# Patient Record
Sex: Female | Born: 1965
Health system: Southern US, Community
[De-identification: ages and names within clinical notes are randomized; demographics above are authoritative.]

## PROBLEM LIST (undated history)

## (undated) DIAGNOSIS — K589 Irritable bowel syndrome without diarrhea: Secondary | ICD-10-CM

## (undated) DIAGNOSIS — I10 Essential (primary) hypertension: Secondary | ICD-10-CM

## (undated) DIAGNOSIS — K219 Gastro-esophageal reflux disease without esophagitis: Secondary | ICD-10-CM

## (undated) DIAGNOSIS — K921 Melena: Secondary | ICD-10-CM

## (undated) DIAGNOSIS — E079 Disorder of thyroid, unspecified: Secondary | ICD-10-CM

## (undated) DIAGNOSIS — K635 Polyp of colon: Secondary | ICD-10-CM

## (undated) HISTORY — PX: TONSILLECTOMY: SUR1361

## (undated) HISTORY — DX: Irritable bowel syndrome, unspecified: K58.9

## (undated) HISTORY — DX: Gastro-esophageal reflux disease without esophagitis: K21.9

## (undated) HISTORY — PX: FOOT SURGERY: SHX648

## (undated) HISTORY — PX: CHOLECYSTECTOMY: SHX55

## (undated) HISTORY — DX: Essential (primary) hypertension: I10

## (undated) HISTORY — DX: Melena: K92.1

## (undated) HISTORY — PX: DENTAL SURGERY: SHX609

## (undated) HISTORY — DX: Polyp of colon: K63.5

---

## 2006-08-13 HISTORY — PX: ESOPHAGOGASTRODUODENOSCOPY: SHX1529

## 2007-01-18 ENCOUNTER — Ambulatory Visit (HOSPITAL_COMMUNITY): Admission: RE | Admit: 2007-01-18 | Discharge: 2007-01-18 | Payer: Self-pay | Admitting: Family Medicine

## 2007-01-31 ENCOUNTER — Encounter (HOSPITAL_COMMUNITY): Admission: RE | Admit: 2007-01-31 | Discharge: 2007-03-02 | Payer: Self-pay | Admitting: Family Medicine

## 2007-02-22 ENCOUNTER — Ambulatory Visit: Payer: Self-pay | Admitting: Internal Medicine

## 2007-03-24 ENCOUNTER — Ambulatory Visit: Payer: Self-pay | Admitting: Internal Medicine

## 2008-03-14 ENCOUNTER — Ambulatory Visit: Payer: Self-pay | Admitting: Internal Medicine

## 2008-12-11 ENCOUNTER — Encounter (HOSPITAL_COMMUNITY): Admission: RE | Admit: 2008-12-11 | Discharge: 2009-01-10 | Payer: Self-pay | Admitting: Family Medicine

## 2008-12-17 DIAGNOSIS — K449 Diaphragmatic hernia without obstruction or gangrene: Secondary | ICD-10-CM | POA: Insufficient documentation

## 2008-12-17 DIAGNOSIS — I10 Essential (primary) hypertension: Secondary | ICD-10-CM | POA: Insufficient documentation

## 2008-12-17 DIAGNOSIS — R1011 Right upper quadrant pain: Secondary | ICD-10-CM | POA: Insufficient documentation

## 2008-12-17 DIAGNOSIS — K219 Gastro-esophageal reflux disease without esophagitis: Secondary | ICD-10-CM

## 2008-12-18 ENCOUNTER — Ambulatory Visit: Payer: Self-pay | Admitting: Internal Medicine

## 2008-12-26 ENCOUNTER — Observation Stay (HOSPITAL_COMMUNITY): Admission: RE | Admit: 2008-12-26 | Discharge: 2008-12-27 | Payer: Self-pay | Admitting: General Surgery

## 2008-12-26 ENCOUNTER — Encounter (INDEPENDENT_AMBULATORY_CARE_PROVIDER_SITE_OTHER): Payer: Self-pay | Admitting: General Surgery

## 2009-08-27 ENCOUNTER — Ambulatory Visit: Payer: Self-pay | Admitting: Gastroenterology

## 2009-08-27 DIAGNOSIS — K921 Melena: Secondary | ICD-10-CM | POA: Insufficient documentation

## 2009-08-27 DIAGNOSIS — R109 Unspecified abdominal pain: Secondary | ICD-10-CM | POA: Insufficient documentation

## 2009-08-28 ENCOUNTER — Encounter: Payer: Self-pay | Admitting: Internal Medicine

## 2009-09-16 ENCOUNTER — Ambulatory Visit (HOSPITAL_COMMUNITY): Admission: RE | Admit: 2009-09-16 | Discharge: 2009-09-16 | Payer: Self-pay | Admitting: Internal Medicine

## 2009-09-16 ENCOUNTER — Ambulatory Visit: Payer: Self-pay | Admitting: Internal Medicine

## 2009-09-16 ENCOUNTER — Encounter: Payer: Self-pay | Admitting: Internal Medicine

## 2009-09-16 HISTORY — PX: COLONOSCOPY: SHX174

## 2009-11-04 ENCOUNTER — Ambulatory Visit: Payer: Self-pay | Admitting: Internal Medicine

## 2009-11-04 DIAGNOSIS — K589 Irritable bowel syndrome without diarrhea: Secondary | ICD-10-CM

## 2009-11-04 DIAGNOSIS — D126 Benign neoplasm of colon, unspecified: Secondary | ICD-10-CM | POA: Insufficient documentation

## 2010-01-01 ENCOUNTER — Telehealth (INDEPENDENT_AMBULATORY_CARE_PROVIDER_SITE_OTHER): Payer: Self-pay

## 2010-03-06 ENCOUNTER — Encounter (INDEPENDENT_AMBULATORY_CARE_PROVIDER_SITE_OTHER): Payer: Self-pay | Admitting: *Deleted

## 2010-12-02 NOTE — Progress Notes (Signed)
Summary: change in rx  Phone Note Call from Patient Call back at Work Phone [phone]   Caller: Patient Summary of Call: pt called- (left voicemail)- Zegerid is costing her $108.00 a month after insurance. pt has already tried prilosec, aciphex, and nexium. She wants to know if there is anything cheaper she can try. We do not have any Zegerid rebate cards. please advise  Initial call taken by: Hendricks Limes LPN,  January 01, 2010 8:57 AM     Appended Document: change in rx (PPI)    Prescriptions: LANSOPRAZOLE 30 MG CPDR (LANSOPRAZOLE) one by mouth 30 mins before breakfast daily  #30 x 11   Entered and Authorized by:   Leanna Battles. Dixon Boos   Signed by:   Leanna Battles Lewis PA-C on 01/01/2010   Method used:   Print then Give to Patient   RxID:   [id number]    Let's try this one. Have her let us know if still too expensive. Please fax to pharmacy of choice.  Appended Document: change in rx LM at work number for pt to return call with pharmacy information  Appended Document: change in rx rx faxed to [facility]/ [facility]

## 2010-12-02 NOTE — Assessment & Plan Note (Signed)
Summary: PP FU IN 6 WEEKS WITH EXTENDER/SS   Visit Type:  Follow-up Visit Primary Care Provider:  Phillips Odor  Chief Complaint:  PP f/u procedure.  History of Present Illness: Miss Michelle [name] is here for f/u recent TCS done for chronic intermittent toilet tissue hematochezia.  She has GERD well-controlled on name brand Zegerid.  She had normal TI on recent colonoscopy.  Friable internal hemorrhoids treated with Canasa suppositories.  Sigmoid colon polyp was hyperplastic. Denies further rectal bleeding.  BM once per day. No abd pain.  GYN exam planned for 11/27/09.  Menstrual cycles irregular.       Current Medications (verified): 1)  Atenolol 25 Mg Tabs (Atenolol) .... Once Daily 2)  Fish Oil .... Once Daily 3)  Multivitamins  Tabs (Multiple Vitamin) .... As Directed 4)  Advil 200 Mg Tabs (Ibuprofen) .... As Needed 5)  Zegerid 40-1680 Mg Pack (Omeprazole-Sodium Bicarbonate) .... Take 1 Tablet By Mouth Once A Day 6)  Valium 2 Mg Tabs (Diazepam) .... 1/2 Tablet in The Am and 1/2 Tablet in The Pm  Allergies (verified): No Known Drug Allergies  Review of Systems      See HPI  Vital Signs:  Patient profile:   45 year old female Height:      64.5 inches Weight:      123 pounds BMI:     20.86 Temp:     98.0 degrees F oral Pulse rate:   64 / minute BP sitting:   98 / 72  (left arm) Cuff size:   regular  Vitals Entered By: Cloria Spring LPN (November 04, 2009 10:36 AM)  Physical Exam  General:  Well developed, well nourished, no acute distress. Head:  Normocephalic and atraumatic. Eyes:  Sclera nonicteric. Mouth:  OP moist. Abdomen:  Bowel sounds normal.  Abdomen is soft, nontender, nondistended.  No rebound or guarding.  No hepatosplenomegaly, masses or hernias.  No abdominal bruits.  Neurologic:  Alert and  oriented x4;  grossly normal neurologically. Skin:  Intact without significant lesions or rashes. Psych:  Alert and cooperative. Normal mood and affect.  Impression &  Recommendations:  Problem # 1:  HEMATOCHEZIA (ICD-578.1) Secondary to hemorrhoids, resolved.  Problem # 2:  IBS (ICD-564.1) Doing well. Orders: Est. Patient Level II (16109)  Problem # 3:  GERD (ICD-530.81) Controlled on Zegerid. Orders: Est. Patient Level II (60454)  Problem # 4:  COLONIC POLYPS, HYPERPLASTIC (ICD-211.3) Due next colonoscopy in 2020 (10 year f/u given no personal h/o adenomatous polyps or FH of CRC).

## 2010-12-02 NOTE — Letter (Signed)
Summary: Recall Office Visit  [facility]  [address]   [place], [place] [address]   Phone: [phone]  Fax: [phone]      Mar 06, 2010   [facility] [address] [place], [place]  [address] 1966/05/03   Dear Ms. Knauer,   According to our records, it is time for you to schedule a follow-up office visit with [place].   At your convenience, please call [phone] to schedule an office visit. If you have any questions, concerns, or feel that this letter is in error, we would appreciate your call.   Sincerely,    Diana Eves  [facility] Ph: [phone]   Fax: [phone]

## 2011-01-19 IMAGING — US US ABDOMEN COMPLETE
1 series · 14 of 25 positions shown · non-contrast
Comparison: 01/18/2007

CLINICAL DATA: Abdominal pain

ABDOMEN ULTRASOUND
TECHNIQUE: Complete abdominal ultrasound examination was performed
including evaluation of the liver, gallbladder, bile ducts,
pancreas, kidneys, spleen, IVC, and abdominal aorta.

[Series 1: unknown · 0.30mm/px · 14 of 59 slices shown]
[im 1/59]
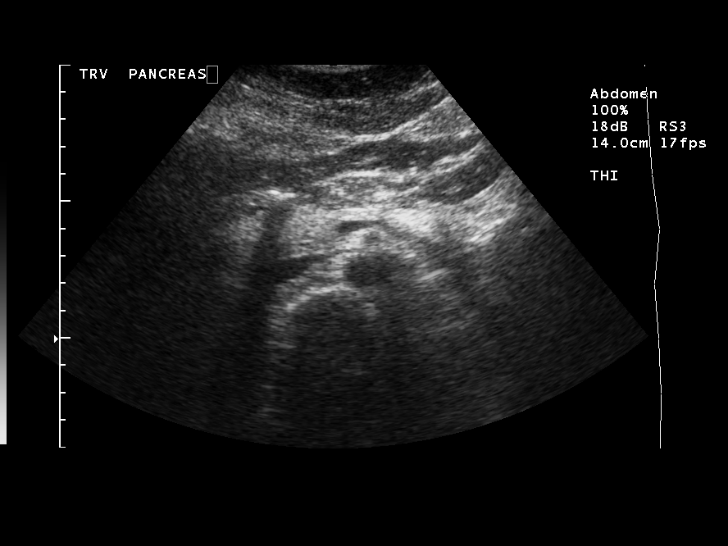
[im 5/59]
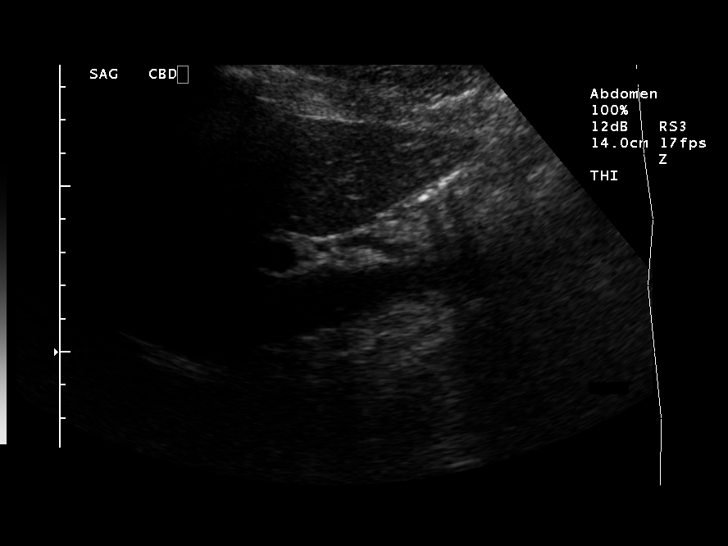
[im 10/59]
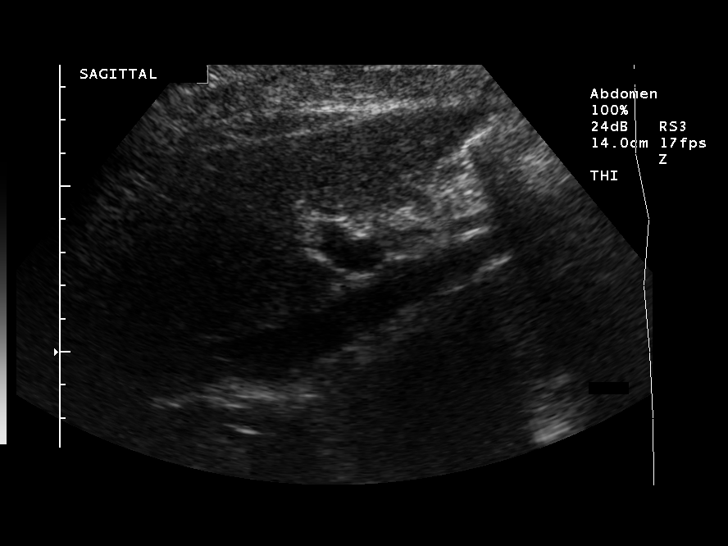
[im 15/59]
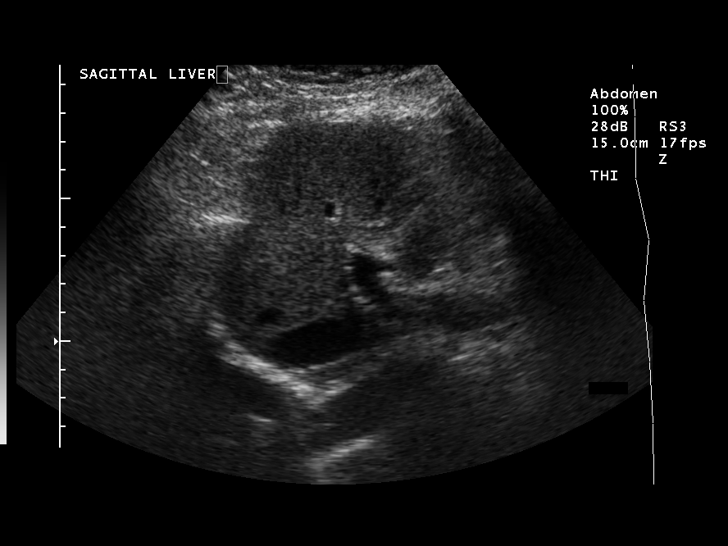
[im 20/59]
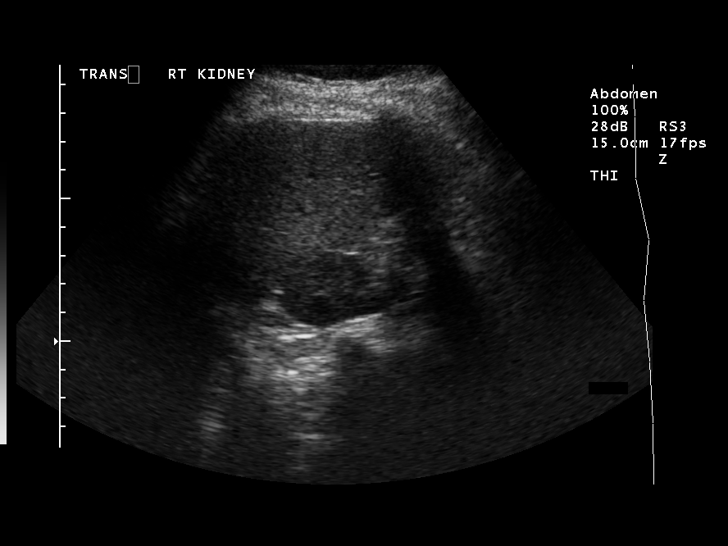
[im 22/59]
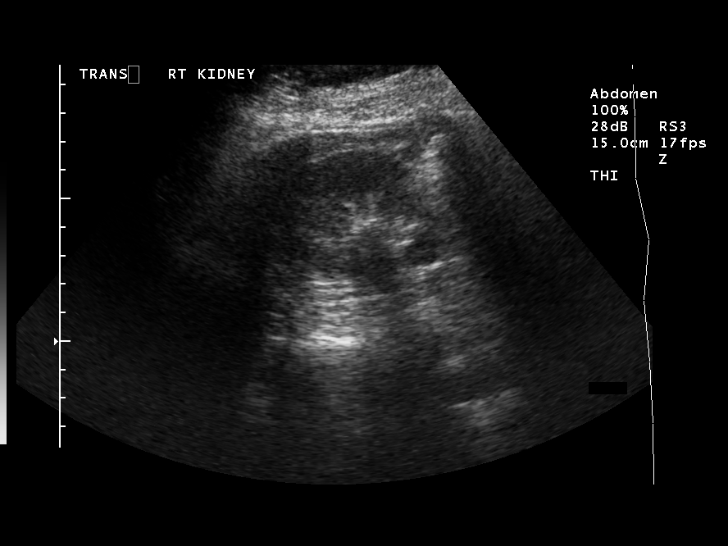
[im 27/59]
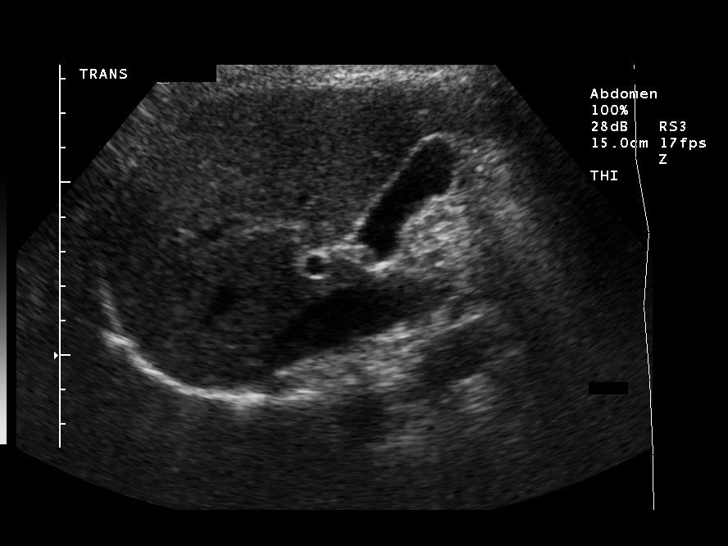
[im 32/59]
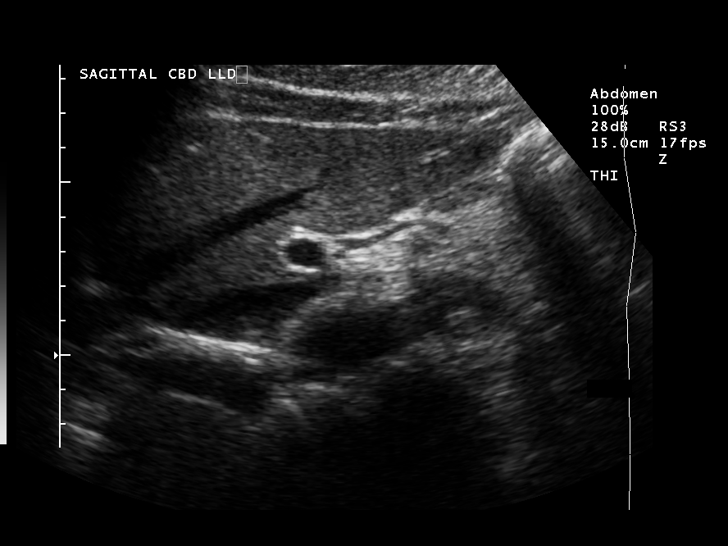
[im 37/59]
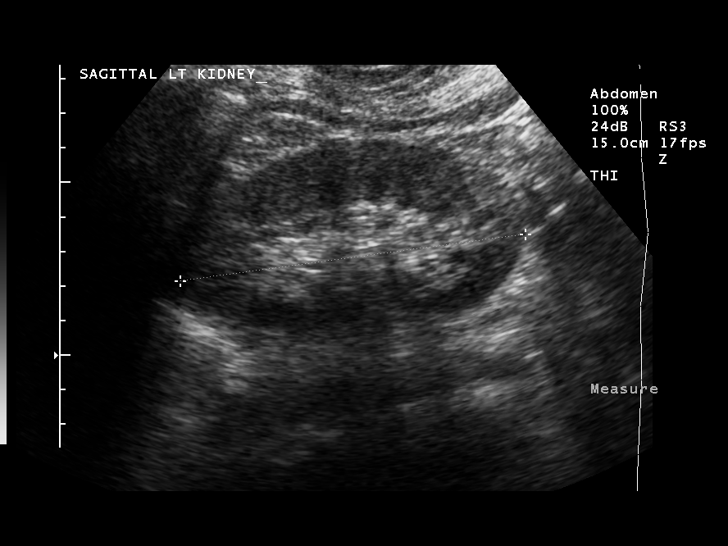
[im 39/59]
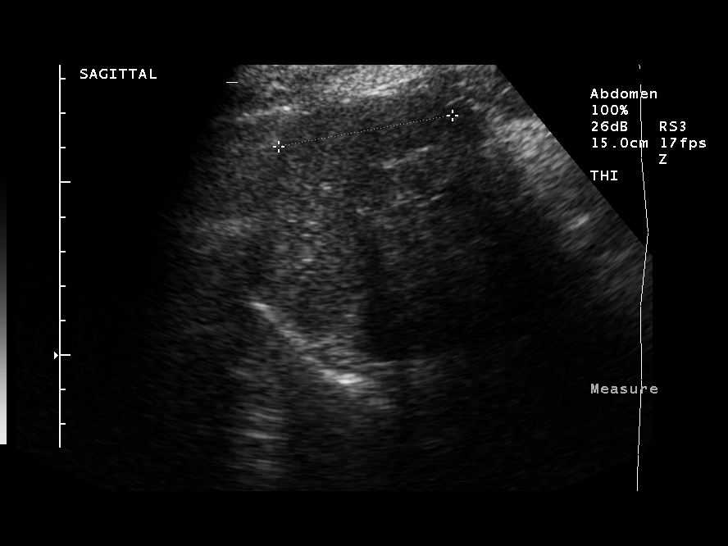
[im 44/59]
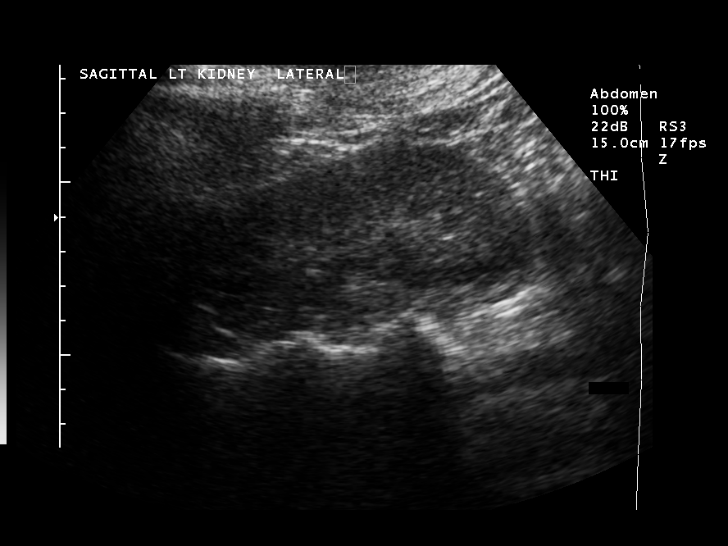
[im 49/59]
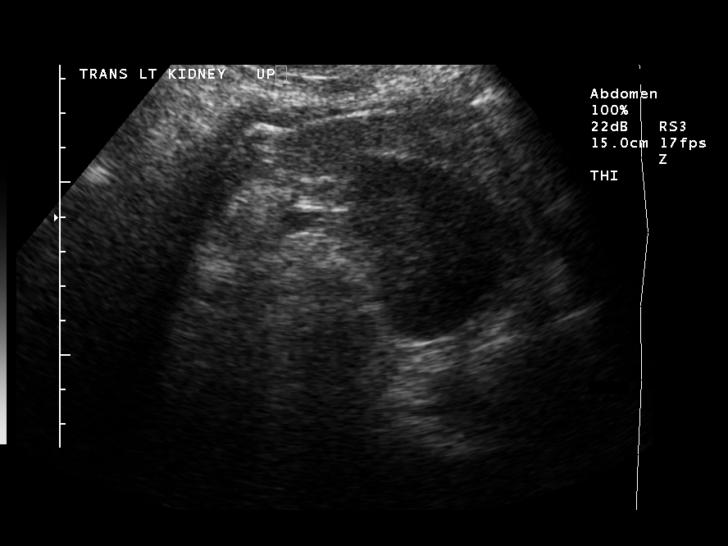
[im 54/59]
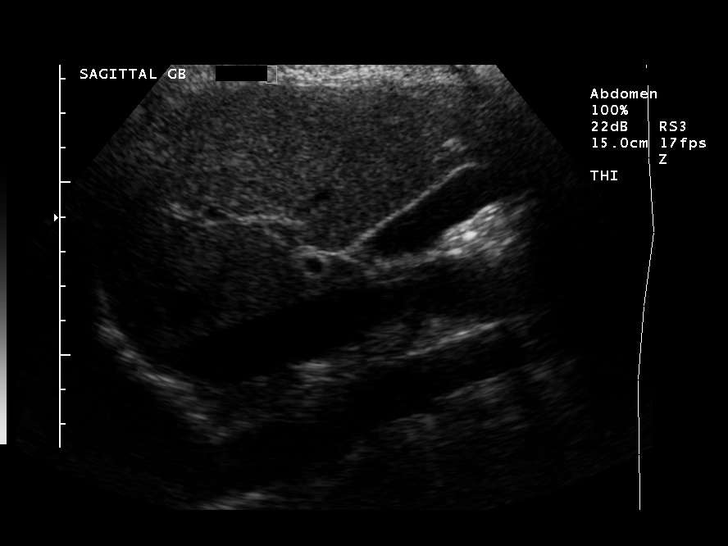
[im 59/59]
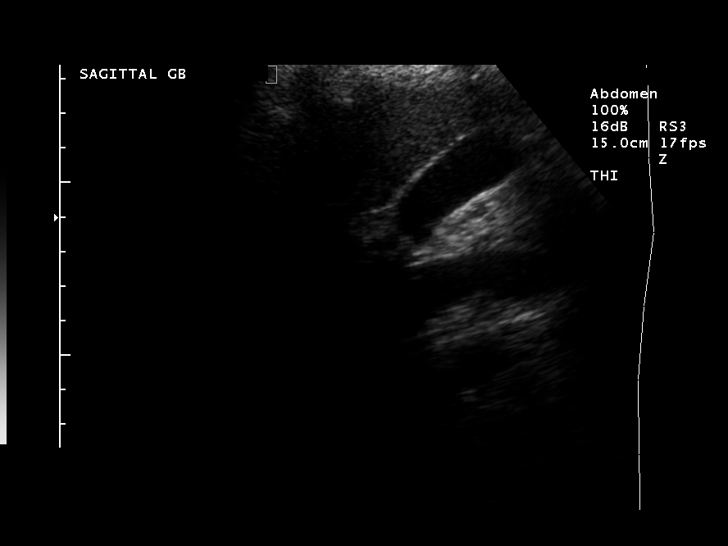

[14 of 25 positions shown; findings below may reference images not displayed]

FINDINGS: Gallbladder normally distended without stones or wall thickening.
No sonographic Murphy sign.
Common bile duct normal caliber, 5.2 mm diameter.
Liver, pancreas, and spleen normal appearance, spleen 5.1 cm
length.
Kidneys normal size and morphology, 9.6 cm length right and 10.0 cm
length left.
Aorta and IVC normal.
No free fluid.
IMPRESSION: No acute upper abdominal abnormalities.

REF:A1 DICTATED: 12/11/2008 [DATE]

## 2011-02-17 LAB — DIFFERENTIAL
Basophils Absolute: 0 10*3/uL (ref 0.0–0.1)
Basophils Absolute: 0 10*3/uL (ref 0.0–0.1)
Eosinophils Relative: 0 % (ref 0–5)
Lymphocytes Relative: 15 % (ref 12–46)
Lymphocytes Relative: 24 % (ref 12–46)
Lymphs Abs: 1.3 10*3/uL (ref 0.7–4.0)
Lymphs Abs: 1.5 10*3/uL (ref 0.7–4.0)
Monocytes Absolute: 0.6 10*3/uL (ref 0.1–1.0)
Neutro Abs: 4.4 10*3/uL (ref 1.7–7.7)
Neutrophils Relative %: 69 % (ref 43–77)

## 2011-02-17 LAB — BASIC METABOLIC PANEL
BUN: 9 mg/dL (ref 6–23)
CO2: 26 mEq/L (ref 19–32)
Calcium: 9.1 mg/dL (ref 8.4–10.5)
Chloride: 108 mEq/L (ref 96–112)
GFR calc non Af Amer: >60 mL/min (ref >60)
GFR calc non Af Amer: >60 mL/min (ref >60)
Glucose, Bld: 101 mg/dL — ABNORMAL HIGH (ref 70–99)
Glucose, Bld: 87 mg/dL (ref 70–99)
Potassium: 4 mEq/L (ref 3.5–5.1)
Sodium: 139 mEq/L (ref 135–145)

## 2011-02-17 LAB — HEPATIC FUNCTION PANEL
ALT: 26 U/L (ref 0–35)
AST: 15 U/L (ref 0–37)
Albumin: 3.9 g/dL (ref 3.5–5.2)
Alkaline Phosphatase: 51 U/L (ref 39–117)
Bilirubin, Direct: 0.1 mg/dL (ref 0.0–0.3)
Bilirubin, Direct: 0.1 mg/dL (ref 0.0–0.3)
Indirect Bilirubin: 0.5 mg/dL (ref 0.3–0.9)
Total Protein: 6.2 g/dL (ref 6.0–8.3)

## 2011-02-17 LAB — CBC
HCT: 35.4 % — ABNORMAL LOW (ref 36.0–46.0)
Hemoglobin: 12 g/dL (ref 12.0–15.0)
MCV: 93.5 fL (ref 78.0–100.0)
Platelets: 157 10*3/uL (ref 150–400)
RDW: 13.2 % (ref 11.5–15.5)
RDW: 13.4 % (ref 11.5–15.5)
WBC: 6.4 10*3/uL (ref 4.0–10.5)

## 2011-02-17 LAB — AMYLASE: Amylase: 61 U/L (ref 27–131)

## 2011-02-17 LAB — HCG, QUANTITATIVE, PREGNANCY: hCG, Beta Chain, Quant, S: <2 m[IU]/mL (ref <5)

## 2011-03-17 NOTE — Assessment & Plan Note (Signed)
Michelle [name], Michelle [name]                 CHART#:  [id number]   DATE:  12/18/2008                       DOB:  09-03-1966   CHIEF COMPLAINT:  Nausea, diarrhea, gallbladder problems.   SUBJECTIVE:  The patient is here for a followup.  She called and made  the appointment because of ongoing nausea, abdominal pain, and some  diarrhea.  We last saw her in May 2009.  She has a history of  gastroesophageal reflux disease as well as intermittent right upper  quadrant abdominal pain, well controlled on low-fat diet.  We had felt  that her symptoms were likely emanating from her gallbladder probably in  the involving biliary dyskinesia.  She states around January she started  having more and more postprandial nausea.  She has lost 8 pounds because  she has not been able to eat.  Her symptoms all began with what she  described as GERD and indigestion.  She saw her PCP who switched her  from Aciphex to Zegerid.  The Zegerid helped her heartburn and  indigestion.  She continued to have postprandial nausea, however.  Over  the last couple of weeks, she has noted change in bowel movements from a  bowel movement every day to every other day to now having postprandial  loose stools 2 and 3 times a day, depending on how many times she eats.  She has also had a little bit of bright red blood on the toilet tissue  intermittently with these symptoms.  She has never had a colonoscopy.  She admittedly is very stressed out with having to work and take care of  her family and an elderly mother who she is having to keep check on  frequently.  She recently had abdominal ultrasound, which was  unremarkable.  She had a HIDA scan yesterday, which showed a gallbladder  ejection fraction of 26%, which had declined from 2 years ago when her  gallbladder ejection fraction was 78%.  She states after the half-and-  half cream she did have some nausea and about an hour later had more  nausea and then developed loose stool.  She  had some vague upper  abdominal discomfort.  She tells me that she has a referral scheduled to  see Dr. Barbaraann Barthel for consideration of cholecystectomy.   CURRENT MEDICATIONS:  See updated list.   ALLERGIES:  No known drug allergies.   PHYSICAL EXAMINATION:  VITAL SIGNS:  Weight 117.5, down 8 pounds.  Temp  97.4, blood pressure 120/70, and pulse 80.  GENERAL:  A pleasant, thin, Caucasian female in no acute distress.  SKIN:  Warm and dry.  No jaundice.  HEENT:  Sclerae nonicteric.  Oropharyngeal mucosa moist and pink.  CHEST:  Lungs are clear to auscultation.  CARDIOVASCULAR:  Regular rate and rhythm.  ABDOMEN:  Positive bowel sounds.  Abdomen is soft, nontender, and  nondistended.  No organomegaly or masses.  No rebound or guarding.  No  abdominal bruits or hernias.  RECTAL:  No masses or lesions externally.  No masses in the rectal  vault.  Brown stool.  Heme negative.  LOWER EXTREMITIES:  No edema.   IMPRESSION:  The patient is a 45 year old lady with abdominal pain,  postprandial nausea likely due to biliary dyskinesia.  Surgical  consultation already in process with Dr. Chrissie Noa  Bradford.  She also has  had some loose stools with small volume of bright red blood on the  toilet tissue.  This may be due to benign anorectal source or  irritation.  GERD symptoms now well controlled on Zegerid, discussed  with the patient today.  I would recommend proceeding with surgical  consultation for cholecystectomy.  After she recovers from her surgery  and if she continues to have toilet-tissue hematochezia, would recommend  colonoscopy.   PLAN:  1. Would recommend colonoscopy for ongoing toilet-tissue hematochezia,      but would recommend waiting approximately 2 months after her      cholecystectomy.  2. Trial of HyoMax sublingual 0.125 mg q.a.c., at bedtime, p.r.n., #60      with 1 refill.  3. She will call us if she has any change in her bowels, increased      bleeding and of  course at that time, would recommend pursuing      colonoscopy sooner than later.  She voices understanding of the      recommendations.       Tana Coast, P.A.  Electronically Signed     R. Roetta Sessions, M.D.  Electronically Signed    LL/MEDQ  D:  12/18/2008  T:  12/18/2008  Job:  [id number]   cc:   Corrie Mckusick, M.D.  Barbaraann Barthel, M.D.

## 2011-03-17 NOTE — Assessment & Plan Note (Signed)
Michelle [name], Michelle [name]                 CHART#:  [id number]   DATE:  03/14/2008                       DOB:  [birth year]   CHIEF COMPLAINT:  Followup of reflux and abdominal pain.   SUBJECTIVE:  Michelle [name] is here for followup.  She was last seen on Mar 24, 2007.  She has a history of chronic GERD.  She also has intermittent  vague right upper quadrant abdominal pain.  She has had a prior  abdominal ultrasound and HIDA scan, which were unrevealing.  It is felt  that she may have evolving biliary dyskinesia.  Her symptoms are  controlled with low-fat diet.  Her heartburn is well-controlled on  Aciphex.  If she misses a dose she has recurrent symptoms.  She denies  any problem with her bowel movements, dysphagia, odynophagia, melena or  rectal bleeding.  Her weight is stable.   CURRENT MEDICATIONS:  1. Atenolol 25 mg daily.  2. Fish oil daily.  3. Multivitamin daily.  4. Advil occasionally.  5. Aciphex 20 mg daily.   ALLERGIES:  No known drug allergies.   PHYSICAL EXAM:  Weight 125, temperature 98.4, blood pressure 100/64,  pulse 64.  GENERAL:  A pleasant, well-nourished, well-developed Caucasian female,  in no acute distress.  ABDOMEN:  Positive bowel sounds, soft, nontender, nondistended, no  organomegaly or masses, no rebound or guarding.  LOWER EXTREMITIES:  No edema.   IMPRESSION:  1. Gastroesophageal reflux disease, well-controlled on Aciphex.  2. Intermittent vague right upper quadrant abdominal pain, well-      controlled with low-fat diet, possibly emanating from her      gallbladder.   PLAN:  1. Continue Aciphex 20 mg daily #31, eleven refills given.  2. Antireflux measures, lifestyle emphasized.  3. If she has progressive right upper quadrant abdominal pain, we may      need to consider      further evaluating the gallbladder.  4. Office visit in two years, or sooner if needed.       Tana Coast, P.A.  Electronically Signed     R. Roetta Sessions, M.D.  Electronically Signed    LL/MEDQ  D:  03/14/2008  T:  03/14/2008  Job:  [id number]   cc:   Corrie Mckusick, M.D.

## 2011-03-17 NOTE — Assessment & Plan Note (Signed)
[name], [name]                 CHART#:  [id number]   DATE:  03/24/2007                       DOB:  [birth year]   Follow up of abdominal pain, reflux -- last seen for 02/22/2007.  The  patient has had an ultrasound of her gallbladder which was negative.  A  HIDA demonstrated a gallbladder EF of 52%.  Prior EGD by Dr. Linna Darner, one  year ago, demonstrated only a small hiatal hernia; and some minimal  gastritis, reportedly H. pylori biopsy negative.  The patient was doing  poorly, as far as reflexes concerned, on omeprazole.  I switched her to  the Aciphex 20 mg orally daily when she was here one month ago.  This  has been associated with marked improvement in her reflux; in fact,  abolishment of the reflux symptoms she had been having.  She has avoided  fatty foods such as french fries and the postprandial right upper  quadrant abdominal pain she had been having has subsided as well.  She  is pleased with progress thus far.   CURRENT MEDICATIONS:  The updated list.   ALLERGIES:  No known drug allergies.   PHYSICAL EXAMINATION:  VITAL SIGNS:  Weight 129, height 5 feet 4 1/2  inches.  Temp 98.3, BP 102/70, pulse 60.  A detailed exam deferred.   ASSESSMENT:  1. Gastroesophageal reflux disease symptoms now well controlled on      Aciphex 20 mg orally daily.  Will give her some samples and a one-      year refill.  2. Anti-reflux diet/lifestyle emphasized.  3. Intermittent vague right upper quadrant abdominal pain.  I told the      patient that I suspect those symptoms are emanating from her      gallbladder.  However with any food avoidance, she is doing well.      I would not do anything else at this time.  Although, at some point      in time we may be seen biliary dyskinesia in evolution and if her      symptoms become more frequent or worse in severity, then we would      need to readdress.  Otherwise, unless something comes up, plan to      see this nice lady back in one  year.       Michelle [name], M.D.  Electronically Signed     RMR/MEDQ  D:  03/24/2007  T:  03/24/2007  Job:  [id number]   cc:   Michelle [name], M.D.

## 2011-03-17 NOTE — Op Note (Signed)
Michelle [name], Michelle [name]                ACCOUNT NO.:  [id number]   MEDICAL RECORD NO.:  [id number]          PATIENT TYPE:  OIB   LOCATION:  A303                          FACILITY:  [facility]   PHYSICIAN:  Barbaraann Barthel, M.D. DATE OF BIRTH:  [birth year]   DATE OF PROCEDURE:  12/26/2008  DATE OF DISCHARGE:                               OPERATIVE REPORT   SURGEON:  Dr. Barbaraann Barthel, MD   PREOPERATIVE DIAGNOSIS:  Cholecystitis secondary to biliary dyskinesia.   POSTOPERATIVE DIAGNOSIS:  Cholecystitis secondary to biliary dyskinesia.   PROCEDURE:  Laparoscopic cholecystectomy.   NOTE:  This is a 45 year old white female who had 2-year history of  recurrent right upper quadrant pain and nausea.  The pain was  postprandial in nature and radiated to her back.  She was worked up by  [employer] and she was found on to have no stones on  sonography, however, a positive hepatobiliary scan which suggested  cholecystitis secondary to biliary dyskinesia.  She was referred by the  [facility].   PROCEDURE:  Specimen is gallbladder.   Wound classification is clean contaminated.   TECHNIQUE:  The patient was placed in the supine position after the  adequate administration of general anesthesia via endotracheal  intubation, her entire abdomen was prepped with Betadine solution and  draped in the usual manner.  Foley catheter was aseptically inserted and  a periumbilical incision was carried out over the superior aspect of the  umbilicus.  The fascia was elevated and grasped with a sharp towel clip  and a Veress needle was inserted and confirmed position with a saline  drop test.  We then insufflated the abdomen with approximately 3.5 L of  CO2 and then using the Visiport technique a 11-mm cannula was placed in  the umbilicus incision and then another 11-mm cannula was placed in the  epigastrium and two 5-mm cannula was placed in the right upper quadrant  laterally under direct vision.  The  gallbladder was grasped.  The  adhesions were taken down.  The cystic duct was small and was not  cannulated.  It was doubly silver clipped on the side of the common bile  duct and singly silver clipped on the side of the gallbladder and  divided as was the cystic artery.  The gallbladder was then removed  without spillage using the hook cautery device to remove it from the  liver bed.  We then removed it from the abdomen using the EndoCatch  device.  We then irrigated.  I elected to leave some Surgicel in the  liver bed and a Jackson-Pratt drain which exited from one of the 5-mm  cannula sites.  We then desufflated the abdomen.  I closed the fascia in  the area of the umbilicus with O Polysorb suture and then all skin  incisions were then closed with the stapling device.  We used 0.5%  Sensorcaine at port sites for postoperative comfort.  Prior to closure,  all sponge, needle, instrument  counts found be correct.  Estimated blood loss was minimal.  The patient  received 1100 mL  crystalloids intraoperatively.  As stated, 1 Al Pimple drain was placed in the liver bed and sutured to the skin with 3-0  nylon.  There were no complications.      Barbaraann Barthel, M.D.  Electronically Signed     WB/MEDQ  D:  12/26/2008  T:  12/26/2008  Job:  [id number]

## 2011-08-21 ENCOUNTER — Encounter: Payer: Self-pay | Admitting: Internal Medicine

## 2011-09-21 LAB — COMPREHENSIVE METABOLIC PANEL
ALT: 23 U/L (ref 7–35)
AST: 23 U/L
Creat: 0.63
Total Bilirubin: 0.5 mg/dL

## 2011-09-21 LAB — CBC WITH DIFFERENTIAL/PLATELET
HCT: 41 %
MCHC: 32.1
MCV: 93.5 fL
RDW: 13.3

## 2011-10-20 ENCOUNTER — Encounter: Payer: Self-pay | Admitting: Internal Medicine

## 2011-12-02 ENCOUNTER — Encounter: Payer: Self-pay | Admitting: Internal Medicine

## 2011-12-03 ENCOUNTER — Ambulatory Visit (INDEPENDENT_AMBULATORY_CARE_PROVIDER_SITE_OTHER): Payer: Medicare HMO | Admitting: Gastroenterology

## 2011-12-03 ENCOUNTER — Encounter: Payer: Self-pay | Admitting: Gastroenterology

## 2011-12-03 VITALS — BP 116/70 | HR 77 | Temp 97.6°F | Ht 64.0 in | Wt 136.8 lb

## 2011-12-03 DIAGNOSIS — R1011 Right upper quadrant pain: Secondary | ICD-10-CM

## 2011-12-03 DIAGNOSIS — K219 Gastro-esophageal reflux disease without esophagitis: Secondary | ICD-10-CM

## 2011-12-03 DIAGNOSIS — K589 Irritable bowel syndrome without diarrhea: Secondary | ICD-10-CM

## 2011-12-03 NOTE — Assessment & Plan Note (Signed)
Controlled with Aciphex, continue and return in 1 year.

## 2011-12-03 NOTE — Assessment & Plan Note (Signed)
Notes intermittent, chronic RUQ discomfort that only lasts a few seconds, sometimes associated with bending over. On physical exam, appears to be almost located right lower rib cage. Doubt GI etiology. Instructed to monitor and contact if further issues. ?musculoskeletal component.

## 2011-12-03 NOTE — Progress Notes (Signed)
  Referring Provider: No ref. provider found Primary Care Physician:  Colette Ribas, MD, MD Primary Gastroenterologist: Dr. Jena Gauss   Chief Complaint  Patient presents with  . Follow-up    HPI:   Ms. Michelle [name] today in follow-up for hx of GERD and IBS. Doing well on Aciphex, likes the cost. No dysphagia. Bowel habits normal for pt, not on a probiotic currently due to cost. No blood in stool. Notes intermittent RUQ discomfort that lasts a few seconds periodically. Sometimes bending over may feel like a "right-sided" cramp.  Cholecystectomy in Feb 2010. No loss of appetite, N/V.    Past Medical History  Diagnosis Date  . HTN (hypertension)   . GERD (gastroesophageal reflux disease)   . Colon polyps     hyperplastic polyps  . IBS (irritable bowel syndrome)   . Hematochezia     Past Surgical History  Procedure Date  . Tonsillectomy   . Foot surgery   . Esophagogastroduodenoscopy 08/13/06    hiatal hernia/mild gastritis  . Colonoscopy 09/16/09    hyperplastic polyps    Current Outpatient Prescriptions  Medication Sig Dispense Refill  . ACIPHEX 20 MG tablet Take 20 mg by mouth daily.       Marland Kitchen atenolol (TENORMIN) 25 MG tablet Take 25 mg by mouth daily.         Allergies as of 12/03/2011  . (No Known Allergies)     History   Social History  . Marital Status: Married    Spouse Name: N/A    Number of Children: N/A  . Years of Education: N/A   Social History Main Topics  . Smoking status: Never Smoker   . Smokeless tobacco: None  . Alcohol Use: No  . Drug Use: No  . Sexually Active: None   Other Topics Concern  . None   Social History Narrative  . None    Review of Systems: Gen: Denies fever, chills, anorexia. Denies fatigue, weakness, weight loss.  CV: Denies chest pain, palpitations, syncope, peripheral edema, and claudication. Resp: Denies dyspnea at rest, cough, wheezing, coughing up blood, and pleurisy. GI: Denies vomiting blood, jaundice, and  fecal incontinence.   Denies dysphagia or odynophagia. Derm: Denies rash, itching, dry skin Psych: Denies depression, anxiety, memory loss, confusion. No homicidal or suicidal ideation.  Heme: Denies bruising, bleeding, and enlarged lymph nodes.  Physical Exam: BP 116/70  Pulse 77  Temp(Src) 97.6 F (36.4 C) (Temporal)  Ht 5\' 4"  (1.626 m)  Wt 136 lb 12.8 oz (62.052 kg)  BMI 23.48 kg/m2 General:   Alert and oriented. No distress noted. Pleasant and cooperative.  Head:  Normocephalic and atraumatic. Eyes:  Conjuctiva clear without scleral icterus. Mouth:  Oral mucosa pink and moist. Good dentition. No lesions. Neck:  Supple, without mass or thyromegaly. Heart:  S1, S2 present without murmurs, rubs, or gallops. Regular rate and rhythm. Abdomen:  +BS, soft, non-tender and non-distended. No rebound or guarding. No HSM or masses noted. Msk:  Symmetrical without gross deformities. Normal posture. Extremities:  Without edema. Neurologic:  Alert and  oriented x4;  grossly normal neurologically. Skin:  Intact without significant lesions or rashes. Cervical Nodes:  No significant cervical adenopathy. Psych:  Alert and cooperative. Normal mood and affect.

## 2011-12-03 NOTE — Patient Instructions (Signed)
Continue taking Aciphex daily.   If you notice any further issues with belly discomfort, please call our office. Otherwise, we will see you back in 1 year.

## 2011-12-03 NOTE — Assessment & Plan Note (Signed)
At baseline for pt, no rectal bleeding. No longer on probiotic but doing well through dietary measures. Return in 1 year.

## 2011-12-04 NOTE — Progress Notes (Signed)
Faxed to PCP

## 2011-12-07 NOTE — Progress Notes (Signed)
Received labs from Nov 2012, to be abstracted into epic. CBC normal, CMP normal. Continue with current plan.

## 2012-10-20 ENCOUNTER — Encounter: Payer: Self-pay | Admitting: *Deleted

## 2012-12-02 ENCOUNTER — Encounter: Payer: Self-pay | Admitting: Internal Medicine

## 2012-12-05 ENCOUNTER — Encounter: Payer: Self-pay | Admitting: Urgent Care

## 2012-12-05 ENCOUNTER — Ambulatory Visit (INDEPENDENT_AMBULATORY_CARE_PROVIDER_SITE_OTHER): Payer: Medicare HMO | Admitting: Urgent Care

## 2012-12-05 VITALS — BP 110/65 | HR 75 | Temp 98.1°F | Ht 64.0 in | Wt 139.8 lb

## 2012-12-05 DIAGNOSIS — K589 Irritable bowel syndrome without diarrhea: Secondary | ICD-10-CM

## 2012-12-05 DIAGNOSIS — K219 Gastro-esophageal reflux disease without esophagitis: Secondary | ICD-10-CM

## 2012-12-05 NOTE — Assessment & Plan Note (Signed)
Doing well with increased dietary fiber.  Continue high fiber diet-literature given Next screening colonoscopy 09/2019

## 2012-12-05 NOTE — Patient Instructions (Addendum)
Continue generic aciphex 20mg  daily Follow up with your doctor for regular exams & discuss your osteoporosis & calcium requirements w/ Colette Ribas, MD Office visit in 2 years or sooner if needed Continue high fiber diet Next colonoscopy 09/2019  High-Fiber Diet Fiber is found in fruits, vegetables, and grains. A high-fiber diet encourages the addition of more whole grains, legumes, fruits, and vegetables in your diet. The recommended amount of fiber for adult males is 38 g per day. For adult females, it is 25 g per day. Pregnant and lactating women should get 28 g of fiber per day. If you have a digestive or bowel problem, ask your caregiver for advice before adding high-fiber foods to your diet. Eat a variety of high-fiber foods instead of only a select few type of foods.  PURPOSE  To increase stool bulk.  To make bowel movements more regular to prevent constipation.  To lower cholesterol.  To prevent overeating. WHEN IS THIS DIET USED?  It may be used if you have constipation and hemorrhoids.  It may be used if you have uncomplicated diverticulosis (intestine condition) and irritable bowel syndrome.  It may be used if you need help with weight management.  It may be used if you want to add it to your diet as a protective measure against atherosclerosis, diabetes, and cancer. SOURCES OF FIBER  Whole-grain breads and cereals.  Fruits, such as apples, oranges, bananas, berries, prunes, and pears.  Vegetables, such as green peas, carrots, sweet potatoes, beets, broccoli, cabbage, spinach, and artichokes.  Legumes, such split peas, soy, lentils.  Almonds. FIBER CONTENT IN FOODS Starches and Grains / Dietary Fiber (g)  Cheerios, 1 cup / 3 g  Corn Flakes cereal, 1 cup / 0.7 g  Rice crispy treat cereal, 1 cup / 0.3 g  Instant oatmeal (cooked),  cup / 2 g  Frosted wheat cereal, 1 cup / 5.1 g  Brown, long-grain rice (cooked), 1 cup / 3.5 g  White, long-grain rice  (cooked), 1 cup / 0.6 g  Enriched macaroni (cooked), 1 cup / 2.5 g Legumes / Dietary Fiber (g)  Baked beans (canned, plain, or vegetarian),  cup / 5.2 g  Kidney beans (canned),  cup / 6.8 g  Pinto beans (cooked),  cup / 5.5 g Breads and Crackers / Dietary Fiber (g)  Plain or honey graham crackers, 2 squares / 0.7 g  Saltine crackers, 3 squares / 0.3 g  Plain, salted pretzels, 10 pieces / 1.8 g  Whole-wheat bread, 1 slice / 1.9 g  White bread, 1 slice / 0.7 g  Raisin bread, 1 slice / 1.2 g  Plain bagel, 3 oz / 2 g  Flour tortilla, 1 oz / 0.9 g  Corn tortilla, 1 small / 1.5 g  Hamburger or hotdog bun, 1 small / 0.9 g Fruits / Dietary Fiber (g)  Apple with skin, 1 medium / 4.4 g  Sweetened applesauce,  cup / 1.5 g  Banana,  medium / 1.5 g  Grapes, 10 grapes / 0.4 g  Orange, 1 small / 2.3 g  Raisin, 1.5 oz / 1.6 g  Melon, 1 cup / 1.4 g Vegetables / Dietary Fiber (g)  Green beans (canned),  cup / 1.3 g  Carrots (cooked),  cup / 2.3 g  Broccoli (cooked),  cup / 2.8 g  Peas (cooked),  cup / 4.4 g  Mashed potatoes,  cup / 1.6 g  Lettuce, 1 cup / 0.5 g  Corn (canned),  cup / 1.6 g  Tomato,  cup / 1.1 g Document Released: 10/19/2005 Document Revised: 04/19/2012 Document Reviewed: 01/21/2012 [facility] Patient Information 2013 [place], [place].

## 2012-12-05 NOTE — Progress Notes (Signed)
Primary Care Physician:  Colette Ribas, MD Primary Gastroenterologist:  Dr. Jena Gauss  Chief Complaint  Patient presents with  . Follow-up    GERD/IBS    HPI:  Michelle [name] is a 47 y.o. female here for follow up GERD & IBS.  She is doing very well.  Her IBS is well controlled with eating more fiber.  Her GERD is well-controlled on generic aciphex 20mg  daily.  She did have recent URI, but has fully recovered.  She has osteopenia, but cannot take calcium due to indigestion.  She does take a daily Multivitamin.  She eats a lot dairy.  She discussed her osteopenia w/ her PCP.  She has had irregular menstrual cycles & has been told she is starting menopause. She is followed by gynecology with recent PAP/pelvic exam.   Denies constipation, diarrhea, rectal bleeding, melena or weight loss.   Denies heartburn, indigestion, nausea, vomiting, dysphagia, odynophagia or anorexia.  She has recurrent heartburn if she does not take daily PPI.    Past Medical History  Diagnosis Date  . HTN (hypertension)   . GERD (gastroesophageal reflux disease)   . Colon polyps     hyperplastic polyps  . IBS (irritable bowel syndrome)   . Hematochezia     Past Surgical History  Procedure Date  . Tonsillectomy   . Foot surgery   . Esophagogastroduodenoscopy 08/13/06    hiatal hernia/mild gastritis  . Colonoscopy 09/16/09    RMR: hyperplastic polyps    Current Outpatient Prescriptions  Medication Sig Dispense Refill  . ACIPHEX 20 MG tablet Take 20 mg by mouth daily.       Marland Kitchen atenolol (TENORMIN) 25 MG tablet Take 25 mg by mouth daily.       . diazepam (VALIUM) 2 MG tablet Take 2 mg by mouth at bedtime as needed.         Allergies as of 12/05/2012  . (No Known Allergies)    Family History:There is no known family history of colorectal carcinoma , liver disease, or inflammatory bowel disease.  Problem Relation Age of Onset  . Colon cancer Neg Hx     History   Social History  . Marital Status:  Married    Spouse Name: N/A    Number of Children: N/A  . Years of Education: N/A   Occupational History  . Administrative Asst At [employer]    Social History Main Topics  . Smoking status: Never Smoker   . Smokeless tobacco: Not on file  . Alcohol Use: No  . Drug Use: No  . Sexually Active: Not on file   Other Topics Concern  . Not on file   Social History Narrative   Lives w/ husband    Review of Systems: Gen: Denies any fever, chills, sweats, anorexia, fatigue, weakness, malaise, weight loss, and sleep disorder CV: Denies chest pain, angina, palpitations, syncope, orthopnea, PND, peripheral edema, and claudication. Resp: Denies dyspnea at rest, dyspnea with exercise, cough, sputum, wheezing, coughing up blood, and pleurisy. GI: Denies vomiting blood, jaundice, and fecal incontinence.    GU : Denies urinary burning, blood in urine, urinary frequency, urinary hesitancy, nocturnal urination, and urinary incontinence. MS: Denies joint pain, limitation of movement, and swelling, stiffness, low back pain, extremity pain. Denies muscle weakness, cramps, atrophy.  Derm: Denies rash, itching, dry skin, hives, moles, warts, or unhealing ulcers.  Psych: Denies depression, anxiety, memory loss, suicidal ideation, hallucinations, paranoia, and confusion. Heme: Denies bruising, bleeding, and enlarged lymph nodes. Neuro:  Denies any headaches, dizziness, paresthesias. Endo:  Denies any problems with DM, thyroid, adrenal function.  Physical Exam: BP 110/65  Pulse 75  Temp 98.1 F (36.7 C) (Oral)  Ht 5\' 4"  (1.626 m)  Wt 139 lb 12.8 oz (63.413 kg)  BMI 24.00 kg/m2 No LMP recorded. Patient is not currently having periods (Reason: Perimenopausal). General:   Alert,  Well-developed, well-nourished, pleasant and cooperative in NAD Head:  Normocephalic and atraumatic. Eyes:  Sclera clear, no icterus.   Conjunctiva pink. Ears:  Normal auditory acuity. Nose:  No deformity, discharge, or  lesions. Mouth:  No deformity or lesions,oropharynx pink & moist. Neck:  Supple; no masses or thyromegaly. Lungs:  Clear throughout to auscultation.   No wheezes, crackles, or rhonchi. No acute distress. Heart:  Regular rate and rhythm; no murmurs, clicks, rubs,  or gallops. Abdomen:  Normal bowel sounds.  No bruits.  Soft, non-tender and non-distended without masses, hepatosplenomegaly or hernias noted.  No guarding or rebound tenderness.   Rectal:  Deferred. Msk:  Symmetrical without gross deformities. Normal posture. Pulses:  Normal pulses noted. Extremities:  No clubbing or edema. Neurologic:  Alert and  oriented x4;  grossly normal neurologically. Skin:  Intact without significant lesions or rashes. Lymph Nodes:  No significant cervical adenopathy. Psych:  Alert and cooperative. Normal mood and affect.

## 2012-12-05 NOTE — Progress Notes (Signed)
Faxed to PCP

## 2012-12-05 NOTE — Assessment & Plan Note (Addendum)
Well controlled on rabeprazole 20mg  daily.    Follow up with your doctor for regular exams & discuss your osteoporosis & calcium requirements w/ Colette Ribas, MD Office visit in 2 years or sooner if needed

## 2014-10-31 ENCOUNTER — Encounter: Payer: Self-pay | Admitting: Internal Medicine

## 2016-02-13 DIAGNOSIS — Z1231 Encounter for screening mammogram for malignant neoplasm of breast: Secondary | ICD-10-CM | POA: Diagnosis not present

## 2016-02-13 DIAGNOSIS — Z01419 Encounter for gynecological examination (general) (routine) without abnormal findings: Secondary | ICD-10-CM | POA: Diagnosis not present

## 2016-02-13 DIAGNOSIS — Z6823 Body mass index (BMI) 23.0-23.9, adult: Secondary | ICD-10-CM | POA: Diagnosis not present

## 2016-04-08 DIAGNOSIS — Z1389 Encounter for screening for other disorder: Secondary | ICD-10-CM | POA: Diagnosis not present

## 2016-04-08 DIAGNOSIS — E782 Mixed hyperlipidemia: Secondary | ICD-10-CM | POA: Diagnosis not present

## 2016-04-08 DIAGNOSIS — Z6823 Body mass index (BMI) 23.0-23.9, adult: Secondary | ICD-10-CM | POA: Diagnosis not present

## 2016-09-02 DIAGNOSIS — Z08 Encounter for follow-up examination after completed treatment for malignant neoplasm: Secondary | ICD-10-CM | POA: Diagnosis not present

## 2016-09-02 DIAGNOSIS — B351 Tinea unguium: Secondary | ICD-10-CM | POA: Diagnosis not present

## 2016-09-02 DIAGNOSIS — X32XXXD Exposure to sunlight, subsequent encounter: Secondary | ICD-10-CM | POA: Diagnosis not present

## 2016-09-02 DIAGNOSIS — Z85828 Personal history of other malignant neoplasm of skin: Secondary | ICD-10-CM | POA: Diagnosis not present

## 2016-09-02 DIAGNOSIS — Z1283 Encounter for screening for malignant neoplasm of skin: Secondary | ICD-10-CM | POA: Diagnosis not present

## 2016-09-02 DIAGNOSIS — L570 Actinic keratosis: Secondary | ICD-10-CM | POA: Diagnosis not present

## 2017-02-22 DIAGNOSIS — K219 Gastro-esophageal reflux disease without esophagitis: Secondary | ICD-10-CM | POA: Diagnosis not present

## 2017-02-22 DIAGNOSIS — Z1231 Encounter for screening mammogram for malignant neoplasm of breast: Secondary | ICD-10-CM | POA: Diagnosis not present

## 2017-02-22 DIAGNOSIS — Z01419 Encounter for gynecological examination (general) (routine) without abnormal findings: Secondary | ICD-10-CM | POA: Diagnosis not present

## 2017-02-22 DIAGNOSIS — Z6822 Body mass index (BMI) 22.0-22.9, adult: Secondary | ICD-10-CM | POA: Diagnosis not present

## 2017-08-02 DIAGNOSIS — X32XXXD Exposure to sunlight, subsequent encounter: Secondary | ICD-10-CM | POA: Diagnosis not present

## 2017-08-02 DIAGNOSIS — Z08 Encounter for follow-up examination after completed treatment for malignant neoplasm: Secondary | ICD-10-CM | POA: Diagnosis not present

## 2017-08-02 DIAGNOSIS — Z85828 Personal history of other malignant neoplasm of skin: Secondary | ICD-10-CM | POA: Diagnosis not present

## 2017-08-02 DIAGNOSIS — L570 Actinic keratosis: Secondary | ICD-10-CM | POA: Diagnosis not present

## 2017-08-18 DIAGNOSIS — E063 Autoimmune thyroiditis: Secondary | ICD-10-CM | POA: Diagnosis not present

## 2017-08-18 DIAGNOSIS — Z23 Encounter for immunization: Secondary | ICD-10-CM | POA: Diagnosis not present

## 2017-08-18 DIAGNOSIS — Z6823 Body mass index (BMI) 23.0-23.9, adult: Secondary | ICD-10-CM | POA: Diagnosis not present

## 2017-08-18 DIAGNOSIS — E782 Mixed hyperlipidemia: Secondary | ICD-10-CM | POA: Diagnosis not present

## 2017-08-18 DIAGNOSIS — F419 Anxiety disorder, unspecified: Secondary | ICD-10-CM | POA: Diagnosis not present

## 2017-09-14 DIAGNOSIS — L570 Actinic keratosis: Secondary | ICD-10-CM | POA: Diagnosis not present

## 2017-09-14 DIAGNOSIS — Z23 Encounter for immunization: Secondary | ICD-10-CM | POA: Diagnosis not present

## 2017-09-14 DIAGNOSIS — X32XXXD Exposure to sunlight, subsequent encounter: Secondary | ICD-10-CM | POA: Diagnosis not present

## 2017-09-14 DIAGNOSIS — E063 Autoimmune thyroiditis: Secondary | ICD-10-CM | POA: Diagnosis not present

## 2017-09-14 DIAGNOSIS — Z1389 Encounter for screening for other disorder: Secondary | ICD-10-CM | POA: Diagnosis not present

## 2017-12-28 DIAGNOSIS — R946 Abnormal results of thyroid function studies: Secondary | ICD-10-CM | POA: Diagnosis not present

## 2018-01-03 DIAGNOSIS — Z1389 Encounter for screening for other disorder: Secondary | ICD-10-CM | POA: Diagnosis not present

## 2018-01-03 DIAGNOSIS — Z6822 Body mass index (BMI) 22.0-22.9, adult: Secondary | ICD-10-CM | POA: Diagnosis not present

## 2018-01-03 DIAGNOSIS — R946 Abnormal results of thyroid function studies: Secondary | ICD-10-CM | POA: Diagnosis not present

## 2018-01-20 DIAGNOSIS — R000 Tachycardia, unspecified: Secondary | ICD-10-CM | POA: Diagnosis not present

## 2018-01-20 DIAGNOSIS — R030 Elevated blood-pressure reading, without diagnosis of hypertension: Secondary | ICD-10-CM | POA: Diagnosis not present

## 2018-01-20 DIAGNOSIS — Z1389 Encounter for screening for other disorder: Secondary | ICD-10-CM | POA: Diagnosis not present

## 2018-01-20 DIAGNOSIS — M546 Pain in thoracic spine: Secondary | ICD-10-CM | POA: Diagnosis not present

## 2018-01-20 DIAGNOSIS — Z6821 Body mass index (BMI) 21.0-21.9, adult: Secondary | ICD-10-CM | POA: Diagnosis not present

## 2018-01-25 ENCOUNTER — Emergency Department (HOSPITAL_COMMUNITY)
Admission: EM | Admit: 2018-01-25 | Discharge: 2018-01-26 | Disposition: A | Discharge status: Home / Self Care | Payer: BLUE CROSS/BLUE SHIELD | Attending: Emergency Medicine | Admitting: Emergency Medicine

## 2018-01-25 ENCOUNTER — Other Ambulatory Visit: Payer: Self-pay

## 2018-01-25 ENCOUNTER — Encounter (HOSPITAL_COMMUNITY): Payer: Self-pay | Admitting: Emergency Medicine

## 2018-01-25 ENCOUNTER — Emergency Department (HOSPITAL_COMMUNITY): Payer: BLUE CROSS/BLUE SHIELD

## 2018-01-25 DIAGNOSIS — R002 Palpitations: Secondary | ICD-10-CM | POA: Diagnosis not present

## 2018-01-25 DIAGNOSIS — I10 Essential (primary) hypertension: Secondary | ICD-10-CM | POA: Insufficient documentation

## 2018-01-25 DIAGNOSIS — M791 Myalgia, unspecified site: Secondary | ICD-10-CM | POA: Diagnosis not present

## 2018-01-25 DIAGNOSIS — Z79899 Other long term (current) drug therapy: Secondary | ICD-10-CM | POA: Insufficient documentation

## 2018-01-25 DIAGNOSIS — E0590 Thyrotoxicosis, unspecified without thyrotoxic crisis or storm: Secondary | ICD-10-CM | POA: Insufficient documentation

## 2018-01-25 HISTORY — DX: Disorder of thyroid, unspecified: E07.9

## 2018-01-25 LAB — CBC
HCT: 43.7 % (ref 36.0–46.0)
Hemoglobin: 14.2 g/dL (ref 12.0–15.0)
MCH: 28.9 pg (ref 26.0–34.0)
MCHC: 32.5 g/dL (ref 30.0–36.0)
MCV: 88.8 fL (ref 78.0–100.0)
PLATELETS: 240 10*3/uL (ref 150–400)
RBC: 4.92 MIL/uL (ref 3.87–5.11)
RDW: 12.9 % (ref 11.5–15.5)
WBC: 8.6 10*3/uL (ref 4.0–10.5)

## 2018-01-25 LAB — BASIC METABOLIC PANEL
Anion gap: 12 (ref 5–15)
BUN: 16 mg/dL (ref 6–20)
CALCIUM: 9.6 mg/dL (ref 8.9–10.3)
CO2: 26 mmol/L (ref 22–32)
CREATININE: 0.57 mg/dL (ref 0.44–1.00)
Chloride: 104 mmol/L (ref 101–111)
GFR calc Af Amer: >60 mL/min (ref >60)
GFR calc non Af Amer: >60 mL/min (ref >60)
GLUCOSE: 108 mg/dL — AB (ref 65–99)
Potassium: 3.5 mmol/L (ref 3.5–5.1)
Sodium: 142 mmol/L (ref 135–145)

## 2018-01-25 LAB — TROPONIN I: Troponin I: <0.03 ng/mL (ref <0.03)

## 2018-01-25 MED ORDER — LORAZEPAM 1 MG PO TABS
1.0000 mg | ORAL_TABLET | Freq: Once | ORAL | Status: AC
  Administered 2018-01-25: 1 mg via ORAL
  Filled 2018-01-25: qty 1

## 2018-01-25 NOTE — ED Provider Notes (Signed)
[facility] EMERGENCY DEPARTMENT Provider Note   CSN: [id number] Arrival date & time: 01/25/18  2155     History   Chief Complaint Chief Complaint  Patient presents with  . Palpitations    HPI Michelle [name] Michelle [name] is a 52 y.o. female.  HPI  This is a 52 year old female with a history of hypothyroidism, hypertension who presents with palpitations.  Patient reports she has had issues with palpitations and high blood pressure over the last several days.  She feels she may be in thyroid storm.  Patient reports that she was seen by her primary physician on Friday.  At that time her TSH levels were too high and she was tachycardic.  Her levothyroxine was stopped.  She has not taken any levothyroxine since Friday.  However she has had persistent palpitations since that time.  She also noted today at work that her blood pressure was elevated.  She does have a history of hypertension but no longer takes medications.  She denies any chest pain shortness of breath during this time.  No leg swelling or history of blood clots.  She does report some recent weight loss.  Past Medical History:  Diagnosis Date  . Colon polyps    hyperplastic polyps  . GERD (gastroesophageal reflux disease)   . Hematochezia   . HTN (hypertension)   . IBS (irritable bowel syndrome)   . Thyroid disease     Patient Active Problem List   Diagnosis Date Noted  . COLONIC POLYPS, HYPERPLASTIC 11/04/2009  . IBS 11/04/2009  . HEMATOCHEZIA 08/27/2009  . ABDOMINAL PAIN, LOWER 08/27/2009  . HYPERTENSION 12/17/2008  . GERD 12/17/2008  . HIATAL HERNIA 12/17/2008  . ABDOMINAL PAIN, RIGHT UPPER QUADRANT 12/17/2008    Past Surgical History:  Procedure Laterality Date  . COLONOSCOPY  09/16/09   RMR: hyperplastic polyps  . ESOPHAGOGASTRODUODENOSCOPY  08/13/06   hiatal hernia/mild gastritis  . FOOT SURGERY    . TONSILLECTOMY       OB History   None      Home Medications    Prior to Admission medications     Medication Sig Start Date End Date Taking? Authorizing Provider  omeprazole-sodium bicarbonate (ZEGERID) 40-1100 MG capsule Take 1 capsule by mouth daily as needed (for indigestion).   Yes [provider]  oxymetazoline (AFRIN) 0.05 % nasal spray Place 1 spray into both nostrils 2 (two) times daily as needed for congestion.   Yes [provider]  Propylene Glycol (SYSTANE BALANCE) 0.6 % SOLN Apply 1-2 drops to eye every morning.   Yes [provider]  tiZANidine (ZANAFLEX) 4 MG tablet TAKE ONE TABLET BY MOUTH DAILY AT BEDTIME 01/20/18  Yes [provider]  levothyroxine (SYNTHROID, LEVOTHROID) 25 MCG tablet Take 25 mcg by mouth daily. 01/03/18   [provider]  propranolol (INDERAL) 10 MG tablet Take 1 tablet (10 mg total) by mouth 3 (three) times daily. 01/26/18   Horton, Barbette Hair, MD    Family History Family History  Problem Relation Age of Onset  . Colon cancer Neg Hx     Social History Social History   Tobacco Use  . Smoking status: Never Smoker  . Smokeless tobacco: Never Used  Substance Use Topics  . Alcohol use: No  . Drug use: No     Allergies   Patient has no known allergies.   Review of Systems Review of Systems  Constitutional: Positive for unexpected weight change. Negative for fever.  Respiratory: Negative for shortness of  breath.   Cardiovascular: Positive for palpitations. Negative for chest pain and leg swelling.  Gastrointestinal: Negative for abdominal pain.  Musculoskeletal: Positive for myalgias.  All other systems reviewed and are negative.    Physical Exam Updated Vital Signs BP 136/61   Pulse (!) 113   Temp 98.4 F (36.9 C) (Oral)   Resp 20   Ht 5' 4.5" (1.638 m)   Wt 56.2 kg (124 lb)   SpO2 99%   BMI 20.96 kg/m   Physical Exam  Constitutional: She is oriented to person, place, and time. She appears well-developed and well-nourished.  Anxious appearing, no acute distress  HENT:  Head:  Normocephalic and atraumatic.  Cardiovascular: Regular rhythm and normal heart sounds.  Tachycardia  Pulmonary/Chest: Effort normal. No respiratory distress. She has no wheezes.  Abdominal: Soft. Bowel sounds are normal.  Musculoskeletal: She exhibits no edema.  Neurological: She is alert and oriented to person, place, and time.  Skin: Skin is warm and dry.  Psychiatric: She has a normal mood and affect.  Nursing note and vitals reviewed.    ED Treatments / Results  Labs (all labs ordered are listed, but only abnormal results are displayed) Labs Reviewed  BASIC METABOLIC PANEL - Abnormal; Notable for the following components:      Result Value   Glucose, Bld 108 (*)    All other components within normal limits  TSH - Abnormal; Notable for the following components:   TSH 0.002 (*)    All other components within normal limits  CBC  TROPONIN I  D-DIMER, QUANTITATIVE (NOT AT [facility])    EKG EKG Interpretation  Date/Time:  Tuesday January 25 2018 22:04:47 EDT Ventricular Rate:  117 PR Interval:    QRS Duration: 72 QT Interval:  436 QTC Calculation: 608 [name] Axis:   74 Text Interpretation:  Long QTc Sinus tachycardia Prolonged QT Abnormal ECG Tachycardia when compared to prior Reconfirmed by Thayer Jew [phone]) on 01/26/2018 1:03:10 AM   Radiology Dg Chest 2 View  Result Date: 01/25/2018 CLINICAL DATA:  52 year old female with palpitation. EXAM: CHEST - 2 VIEW COMPARISON:  None. FINDINGS: Mild chronic bronchitic changes. No focal consolidation, pleural effusion, or pneumothorax. The cardiac silhouette is within normal limits. No acute osseous pathology. IMPRESSION: No active cardiopulmonary disease. Electronically Signed   By: Anner Crete M.D.   On: 01/25/2018 22:20    Procedures Procedures (including critical care time)  Medications Ordered in ED Medications  LORazepam (ATIVAN) tablet 1 mg (1 mg Oral Given 01/25/18 2238)     Initial Impression / Assessment and Plan  / ED Course  I have reviewed the triage vital signs and the nursing notes.  Pertinent labs & imaging results that were available during my care of the patient were reviewed by me and considered in my medical decision making (see chart for details).     Patient presents with palpitations and hypertension.  Recent thyroid issues.  Discontinued levothyroxine on Friday.  She reports persistent palpitations and notable high blood pressure.  She is not on any blood pressure medication.  She does report some weight loss as well.  Vital signs notable for heart rate in the 110s.  Otherwise she is afebrile.  Blood pressure 136/61.  She appears to be in sinus tachycardia.  Basic lab work reassuring including troponin and d-dimer.  TSH was obtained and is 0.002 suggestive of hyper active thyroid.  At this time no evidence of thyroid storm; however, feel her symptoms are likely related to  overactive thyroid.  Will start on low-dose propranolol and have her follow-up closely with her primary physician for recheck and full thyroid panel.  Patient was reassured.  After history, exam, and medical workup I feel the patient has been appropriately medically screened and is safe for discharge home. Pertinent diagnoses were discussed with the patient. Patient was given return precautions.   Final Clinical Impressions(s) / ED Diagnoses   Final diagnoses:  Hyperthyroidism  Palpitations    ED Discharge Orders        Ordered    propranolol (INDERAL) 10 MG tablet  3 times daily     01/26/18 0100       Horton, Barbette Hair, MD 01/26/18 [phone]

## 2018-01-25 NOTE — ED Triage Notes (Addendum)
Pt c/o anxiety, heart palpitations and hypertension due to thyroid storm. Pt states pcp stopped her thyroid med Friday.

## 2018-01-26 LAB — TSH: TSH: 0.002 u[IU]/mL — AB (ref 0.350–4.500)

## 2018-01-26 LAB — D-DIMER, QUANTITATIVE: D-Dimer, Quant: <0.27 ug/mL-FEU (ref 0.00–0.50)

## 2018-01-26 MED ORDER — PROPRANOLOL HCL 10 MG PO TABS: 10.0000 mg | ORAL_TABLET | Freq: Three times a day (TID) | ORAL | 0 refills | Status: DC

## 2018-01-26 NOTE — Discharge Instructions (Addendum)
You were seen today for palpitations and high blood pressure.  This may be related to overactive thyroid.  You still have evidence of overactive thyroid but no evidence of thyroid storm at this time.  He will be given a prescription for propranolol which will likely help both.  However you need to follow-up closely with your primary doctor for further evaluation.

## 2018-01-27 DIAGNOSIS — M546 Pain in thoracic spine: Secondary | ICD-10-CM | POA: Diagnosis not present

## 2018-01-27 DIAGNOSIS — R030 Elevated blood-pressure reading, without diagnosis of hypertension: Secondary | ICD-10-CM | POA: Diagnosis not present

## 2018-01-27 DIAGNOSIS — R000 Tachycardia, unspecified: Secondary | ICD-10-CM | POA: Diagnosis not present

## 2018-01-27 DIAGNOSIS — Z1389 Encounter for screening for other disorder: Secondary | ICD-10-CM | POA: Diagnosis not present

## 2018-01-31 DIAGNOSIS — I10 Essential (primary) hypertension: Secondary | ICD-10-CM | POA: Diagnosis not present

## 2018-01-31 DIAGNOSIS — Z6821 Body mass index (BMI) 21.0-21.9, adult: Secondary | ICD-10-CM | POA: Diagnosis not present

## 2018-01-31 DIAGNOSIS — Z1389 Encounter for screening for other disorder: Secondary | ICD-10-CM | POA: Diagnosis not present

## 2018-02-02 DIAGNOSIS — R102 Pelvic and perineal pain: Secondary | ICD-10-CM | POA: Diagnosis not present

## 2018-02-10 DIAGNOSIS — Z6821 Body mass index (BMI) 21.0-21.9, adult: Secondary | ICD-10-CM | POA: Diagnosis not present

## 2018-02-10 DIAGNOSIS — R102 Pelvic and perineal pain: Secondary | ICD-10-CM | POA: Diagnosis not present

## 2018-03-04 DIAGNOSIS — Z1389 Encounter for screening for other disorder: Secondary | ICD-10-CM | POA: Diagnosis not present

## 2018-03-04 DIAGNOSIS — Z6822 Body mass index (BMI) 22.0-22.9, adult: Secondary | ICD-10-CM | POA: Diagnosis not present

## 2018-03-04 DIAGNOSIS — R946 Abnormal results of thyroid function studies: Secondary | ICD-10-CM | POA: Diagnosis not present

## 2018-03-04 DIAGNOSIS — F419 Anxiety disorder, unspecified: Secondary | ICD-10-CM | POA: Diagnosis not present

## 2018-03-04 LAB — TSH: TSH: 0.01 — AB (ref 0.41–5.90)

## 2018-03-07 DIAGNOSIS — Z1231 Encounter for screening mammogram for malignant neoplasm of breast: Secondary | ICD-10-CM | POA: Diagnosis not present

## 2018-03-21 DIAGNOSIS — Z6822 Body mass index (BMI) 22.0-22.9, adult: Secondary | ICD-10-CM | POA: Diagnosis not present

## 2018-03-21 DIAGNOSIS — Z01419 Encounter for gynecological examination (general) (routine) without abnormal findings: Secondary | ICD-10-CM | POA: Diagnosis not present

## 2018-05-02 ENCOUNTER — Encounter: Payer: Self-pay | Admitting: "Endocrinology

## 2018-05-02 ENCOUNTER — Ambulatory Visit (INDEPENDENT_AMBULATORY_CARE_PROVIDER_SITE_OTHER): Payer: BLUE CROSS/BLUE SHIELD | Admitting: "Endocrinology

## 2018-05-02 VITALS — BP 139/77 | HR 84 | Ht 64.0 in | Wt 134.0 lb

## 2018-05-02 DIAGNOSIS — E0590 Thyrotoxicosis, unspecified without thyrotoxic crisis or storm: Secondary | ICD-10-CM | POA: Diagnosis not present

## 2018-05-02 NOTE — Progress Notes (Signed)
Endocrinology Consult Note                                            05/02/2018, 3:45 PM   Subjective:    Patient ID: Michelle [name], female    DOB: 07-Jul-1966, PCP Sharilyn Sites, MD   Past Medical History:  Diagnosis Date  . Colon polyps    hyperplastic polyps  . GERD (gastroesophageal reflux disease)   . Hematochezia   . HTN (hypertension)   . IBS (irritable bowel syndrome)   . Thyroid disease    Past Surgical History:  Procedure Laterality Date  . COLONOSCOPY  09/16/09   RMR: hyperplastic polyps  . ESOPHAGOGASTRODUODENOSCOPY  08/13/06   hiatal hernia/mild gastritis  . FOOT SURGERY    . TONSILLECTOMY     Social History   Socioeconomic History  . Marital status: Married    Spouse name: Not on file  . Number of children: Not on file  . Years of education: Not on file  . Highest education level: Not on file  Occupational History  . Occupation: Data processing manager Asst At [employer]  . Financial resource strain: Not on file  . Food insecurity:    Worry: Not on file    Inability: Not on file  . Transportation needs:    Medical: Not on file    Non-medical: Not on file  Tobacco Use  . Smoking status: Never Smoker  . Smokeless tobacco: Never Used  Substance and Sexual Activity  . Alcohol use: No  . Drug use: No  . Sexual activity: Not on file  Lifestyle  . Physical activity:    Days per week: Not on file    Minutes per session: Not on file  . Stress: Not on file  Relationships  . Social connections:    Talks on phone: Not on file    Gets together: Not on file    Attends religious service: Not on file    Active member of club or organization: Not on file    Attends meetings of clubs or organizations: Not on file    Relationship status: Not on file  Other Topics Concern  . Not on file  Social History Narrative   Lives w/ husband   Outpatient Encounter Medications as of 05/02/2018  Medication Sig  . atenolol (TENORMIN) 25 MG tablet daily.  .  diazepam (VALIUM) 2 MG tablet Take 2 mg by mouth 3 (three) times daily as needed.  Marland Kitchen omeprazole-sodium bicarbonate (ZEGERID) 40-1100 MG capsule Take 1 capsule by mouth daily as needed (for indigestion).  . [DISCONTINUED] levothyroxine (SYNTHROID, LEVOTHROID) 25 MCG tablet Take 25 mcg by mouth daily.  . [DISCONTINUED] oxymetazoline (AFRIN) 0.05 % nasal spray Place 1 spray into both nostrils 2 (two) times daily as needed for congestion.  . [DISCONTINUED] propranolol (INDERAL) 10 MG tablet Take 1 tablet (10 mg total) by mouth 3 (three) times daily.  . [DISCONTINUED] Propylene Glycol (SYSTANE BALANCE) 0.6 % SOLN Apply 1-2 drops to eye every morning.  . [DISCONTINUED] tiZANidine (ZANAFLEX) 4 MG tablet TAKE ONE TABLET BY MOUTH DAILY AT BEDTIME   No facility-administered encounter medications on file as of 05/02/2018.    ALLERGIES: No Known Allergies  VACCINATION STATUS:  There is no immunization history on file for this patient.  HPI Michelle [name] is 52 y.o. female who  presents today with a medical history as above. she is being seen in consultation for abnormal thyroid function test requested by Sharilyn Sites, MD.  -Her history includes history of hypothyroidism for which she was treated with levothyroxine from 25-50 mcg daily for 5 months.  Her last exposure for levothyroxine was in March 2019, when she was found to have suppressed TSH of 0.002. -She was advised to discontinue levothyroxine at that time, and most recent labs on Mar 04, 2018 showed suppressed TSH of 0.014, total T4 8.9. -She has family history of unidentified thyroid dysfunction in 1 of her sisters.  Denies any family history of thyroid cancer.  -She has lost 5 pounds since February.  She denies palpitations, heat intolerance, tremors.  -She denies any history of being overweight or obese.  She is currently on atenolol 25 mg p.o. daily which was started originally for hypertension. -She denies dysphagia, odynophagia, no recent  voice change.  Review of Systems  Constitutional: + weight loss, no fatigue, no subjective hyperthermia, no subjective hypothermia Eyes: no blurry vision, no xerophthalmia ENT: no sore throat, no nodules palpated in throat, no dysphagia/odynophagia, no hoarseness Cardiovascular: no Chest Pain, no Shortness of Breath, no palpitations, no leg swelling Respiratory: no cough, no SOB Gastrointestinal: no Nausea/Vomiting/Diarhhea Musculoskeletal: no muscle/joint aches Skin: no rashes Neurological: no tremors, no numbness, no tingling, no dizziness Psychiatric: no depression, no anxiety  Objective:    BP 139/77   Pulse 84   Ht 5\' 4"  (1.626 m)   Wt 134 lb (60.8 kg)   BMI 23.00 kg/m   Wt Readings from Last 3 Encounters:  05/02/18 134 lb (60.8 kg)  01/25/18 124 lb (56.2 kg)  12/05/12 139 lb 12.8 oz (63.4 kg)    Physical Exam  Constitutional: + Appropriate weight for height, not in acute distress, normal state of mind Eyes: PERRLA, EOMI, no exophthalmos ENT: moist mucous membranes, no thyromegaly, no cervical lymphadenopathy Cardiovascular: normal precordial activity, Regular Rate and Rhythm, no Murmur/Rubs/Gallops Respiratory:  adequate breathing efforts, no gross chest deformity, Clear to auscultation bilaterally Gastrointestinal: abdomen soft, Non -tender, No distension, Bowel Sounds present Musculoskeletal: no gross deformities, strength intact in all four extremities Skin: moist, warm, no rashes Neurological: no tremor with outstretched hands, Deep tendon reflexes normal in all four extremities.  CMP ( most recent) CMP     Component Value Date/Time   NA 142 01/25/2018 2217   NA 140 09/21/2011 1348   K 3.5 01/25/2018 2217   K 4.4 09/21/2011 1348   CL 104 01/25/2018 2217   CL 102 09/21/2011 1348   CO2 26 01/25/2018 2217   CO2 31 09/21/2011 1348   GLUCOSE 108 (H) 01/25/2018 2217   BUN 16 01/25/2018 2217   BUN 14 09/21/2011 1348   CREATININE 0.57 01/25/2018 2217    CREATININE 0.63 09/21/2011 1348   CALCIUM 9.6 01/25/2018 2217   CALCIUM 9.4 09/21/2011 1348   PROT 5.7 (L) 12/27/2008 0450   ALBUMIN 4.4 09/21/2011 1348   AST 23 09/21/2011 1348   ALT 23 09/21/2011 1348   ALKPHOS 76 09/21/2011 1348   BILITOT 0.5 09/21/2011 1348   GFRNONAA >60 01/25/2018 2217   GFRAA >60 01/25/2018 2217      Lab Results  Component Value Date   TSH 0.01 (A) 03/04/2018   TSH 0.002 (L) 01/25/2018    Mar 04, 2018 labs: TSH 0.014, total T4 8.9, free T3 2.1       Assessment & Plan:   1. Subclinical hyperthyroidism -  Michelle [name]  is being seen at a kind request of Sharilyn Sites, MD. - I have reviewed her available thyroid records and clinically evaluated the patient. - Based on reviews, she has subclinical hyperthyroidism,  however,  there is not sufficient information to proceed with definitive treatment plan.  - she will need a repeat,  more complete thyroid function tests including TSH, free T4/free T3, and antithyroid antibodies and thyroid uptake and scan towards confirming the diagnosis.  -she will return in 10 days to review her repeat labs.   If her  labs and scan are indicative of primary hyper thyroidism, she will be approached for definitive treatment preferably with radioactive iodine thyroid ablation.    - I advised her  to maintain close follow up with Sharilyn Sites, MD for primary care needs.   - Time spent with the patient: 45 minutes, of which >50% was spent in obtaining information about her symptoms, reviewing her previous labs, evaluations, and treatments, counseling her about her abnormal thyroid function tests, and developing a plan to confirm the diagnosis and long term treatment as necessary.  Michelle [name] participated in the discussions, expressed understanding, and voiced agreement with the above plans.  All questions were answered to her satisfaction. she is encouraged to contact clinic should she have any questions or concerns prior to  her return visit.  Follow up plan: Return in about 10 days (around 05/12/2018) for labs today, follow up with thyroid uptake and scan.   Glade Lloyd, MD [facility] [address] [place], [place] [address] Phone: [phone]  Fax: [phone]     05/02/2018, 3:45 PM  This note was partially dictated with voice recognition software. Similar sounding words can be transcribed inadequately or may not  be corrected upon review.

## 2018-05-03 LAB — T4, FREE: Free T4: 1.1 ng/dL (ref 0.8–1.8)

## 2018-05-03 LAB — THYROGLOBULIN ANTIBODY: Thyroglobulin Ab: <1 IU/mL (ref <=1)

## 2018-05-03 LAB — TSH: TSH: 5.65 m[IU]/L — AB

## 2018-05-03 LAB — T3, FREE: T3 FREE: 3.1 pg/mL (ref 2.3–4.2)

## 2018-05-03 LAB — THYROID PEROXIDASE ANTIBODY: Thyroperoxidase Ab SerPl-aCnc: 1 IU/mL (ref <9)

## 2018-05-11 ENCOUNTER — Encounter (HOSPITAL_COMMUNITY)
Admission: RE | Admit: 2018-05-11 | Discharge: 2018-05-11 | Disposition: A | Discharge status: Home / Self Care | Payer: BLUE CROSS/BLUE SHIELD | Source: Ambulatory Visit | Attending: "Endocrinology | Admitting: "Endocrinology

## 2018-05-11 ENCOUNTER — Encounter (HOSPITAL_COMMUNITY): Payer: Self-pay

## 2018-05-11 DIAGNOSIS — E0590 Thyrotoxicosis, unspecified without thyrotoxic crisis or storm: Secondary | ICD-10-CM | POA: Insufficient documentation

## 2018-05-11 MED ORDER — SODIUM IODIDE I-123 7.4 MBQ CAPS
300.0000 | ORAL_CAPSULE | Freq: Once | ORAL | Status: AC
  Administered 2018-05-11: 317 via ORAL

## 2018-05-12 ENCOUNTER — Encounter (HOSPITAL_COMMUNITY)
Admission: RE | Admit: 2018-05-12 | Discharge: 2018-05-12 | Disposition: A | Discharge status: Home / Self Care | Payer: BLUE CROSS/BLUE SHIELD | Source: Ambulatory Visit | Attending: "Endocrinology | Admitting: "Endocrinology

## 2018-05-12 DIAGNOSIS — E0590 Thyrotoxicosis, unspecified without thyrotoxic crisis or storm: Secondary | ICD-10-CM | POA: Diagnosis not present

## 2018-05-20 ENCOUNTER — Ambulatory Visit (INDEPENDENT_AMBULATORY_CARE_PROVIDER_SITE_OTHER): Payer: BLUE CROSS/BLUE SHIELD | Admitting: "Endocrinology

## 2018-05-20 ENCOUNTER — Encounter: Payer: Self-pay | Admitting: "Endocrinology

## 2018-05-20 VITALS — BP 136/83 | HR 86 | Ht 64.0 in | Wt 134.0 lb

## 2018-05-20 DIAGNOSIS — E038 Other specified hypothyroidism: Secondary | ICD-10-CM | POA: Insufficient documentation

## 2018-05-20 DIAGNOSIS — E039 Hypothyroidism, unspecified: Secondary | ICD-10-CM | POA: Diagnosis not present

## 2018-05-20 NOTE — Progress Notes (Signed)
Endocrinology follow-up note                                            05/20/2018, 1:53 PM   Subjective:    Patient ID: Michelle [name], female    DOB: Sep 05, 1966, PCP Sharilyn Sites, MD   Past Medical History:  Diagnosis Date  . Colon polyps    hyperplastic polyps  . GERD (gastroesophageal reflux disease)   . Hematochezia   . HTN (hypertension)   . IBS (irritable bowel syndrome)   . Thyroid disease    Past Surgical History:  Procedure Laterality Date  . COLONOSCOPY  09/16/09   RMR: hyperplastic polyps  . ESOPHAGOGASTRODUODENOSCOPY  08/13/06   hiatal hernia/mild gastritis  . FOOT SURGERY    . TONSILLECTOMY     Social History   Socioeconomic History  . Marital status: Married    Spouse name: Not on file  . Number of children: Not on file  . Years of education: Not on file  . Highest education level: Not on file  Occupational History  . Occupation: Data processing manager Asst At [employer]  . Financial resource strain: Not on file  . Food insecurity:    Worry: Not on file    Inability: Not on file  . Transportation needs:    Medical: Not on file    Non-medical: Not on file  Tobacco Use  . Smoking status: Never Smoker  . Smokeless tobacco: Never Used  Substance and Sexual Activity  . Alcohol use: No  . Drug use: No  . Sexual activity: Not on file  Lifestyle  . Physical activity:    Days per week: Not on file    Minutes per session: Not on file  . Stress: Not on file  Relationships  . Social connections:    Talks on phone: Not on file    Gets together: Not on file    Attends religious service: Not on file    Active member of club or organization: Not on file    Attends meetings of clubs or organizations: Not on file    Relationship status: Not on file  Other Topics Concern  . Not on file  Social History Narrative   Lives w/ husband   Outpatient Encounter Medications as of 05/20/2018  Medication Sig  . atenolol (TENORMIN) 25 MG tablet daily.   . diazepam (VALIUM) 2 MG tablet Take 2 mg by mouth 3 (three) times daily as needed.  Marland Kitchen omeprazole-sodium bicarbonate (ZEGERID) 40-1100 MG capsule Take 1 capsule by mouth daily as needed (for indigestion).   No facility-administered encounter medications on file as of 05/20/2018.    ALLERGIES: No Known Allergies  VACCINATION STATUS:  There is no immunization history on file for this patient.  HPI Michelle [name] is 52 y.o. female who presents today with repeat thyroid function tests after she was seen in consultation for abnormal thyroid function tests.   -Is not currently on any antithyroid medications nor thyroid hormone supplements.   -Her history includes history of hypothyroidism for which she was treated with levothyroxine from 25-50 mcg daily for 5 months.  Her last exposure for levothyroxine was in March 2019, when she was found to have suppressed TSH of 0.002. -Her previsit labs show significantly increased TSH of 5.6 low with normal free T4 of 1.1. -She has  family history of unidentified thyroid dysfunction in 1 of her sisters.  Denies any family history of thyroid cancer.  -She has regained the 5 pounds she lost since  February.    She denies palpitations, heat intolerance, tremors.  -She denies any history of being overweight or obese.  She is currently on atenolol 25 mg p.o. daily which was started originally for hypertension. -She denies dysphagia, odynophagia, no recent voice change.  Review of Systems  Constitutional: + Weight, no fatigue, no subjective hyperthermia nor hypothermia.   Eyes: no blurry vision, no xerophthalmia ENT: no sore throat, no nodules palpated in throat, no dysphagia/odynophagia, no hoarseness Cardiovascular: no Chest Pain, no Shortness of Breath, no palpitations, no leg swelling Respiratory: no cough, no SOB Gastrointestinal: no Nausea/Vomiting/Diarhhea Musculoskeletal: no muscle/joint aches Skin: no rashes Neurological: no tremors, no  numbness, no tingling, no dizziness Psychiatric: no depression, no anxiety  Objective:    BP 136/83   Pulse 86   Ht 5\' 4"  (1.626 m)   Wt 134 lb (60.8 kg)   BMI 23.00 kg/m   Wt Readings from Last 3 Encounters:  05/20/18 134 lb (60.8 kg)  05/02/18 134 lb (60.8 kg)  01/25/18 124 lb (56.2 kg)    Physical Exam  Constitutional: + Appropriate weight for height, not in acute distress, normal state of mind Eyes: PERRLA, EOMI, no exophthalmos ENT: moist mucous membranes, no thyromegaly, no cervical lymphadenopathy  Musculoskeletal: no gross deformities, strength intact in all four extremities Skin: moist, warm, no rashes Neurological: no tremor with outstretched hands, Deep tendon reflexes normal in all four extremities.  CMP ( most recent) CMP     Component Value Date/Time   NA 142 01/25/2018 2217   NA 140 09/21/2011 1348   K 3.5 01/25/2018 2217   K 4.4 09/21/2011 1348   CL 104 01/25/2018 2217   CL 102 09/21/2011 1348   CO2 26 01/25/2018 2217   CO2 31 09/21/2011 1348   GLUCOSE 108 (H) 01/25/2018 2217   BUN 16 01/25/2018 2217   BUN 14 09/21/2011 1348   CREATININE 0.57 01/25/2018 2217   CREATININE 0.63 09/21/2011 1348   CALCIUM 9.6 01/25/2018 2217   CALCIUM 9.4 09/21/2011 1348   PROT 5.7 (L) 12/27/2008 0450   ALBUMIN 4.4 09/21/2011 1348   AST 23 09/21/2011 1348   ALT 23 09/21/2011 1348   ALKPHOS 76 09/21/2011 1348   BILITOT 0.5 09/21/2011 1348   GFRNONAA >60 01/25/2018 2217   GFRAA >60 01/25/2018 2217      Lab Results  Component Value Date   TSH 5.65 (H) 05/02/2018   TSH 0.01 (A) 03/04/2018   TSH 0.002 (L) 01/25/2018   FREET4 1.1 05/02/2018    Mar 04, 2018 labs: TSH 0.014, total T4 8.9, free T3 2.1   Assessment & Plan:   1. Subclinical hypothyroidism -Her previsit labs are consistent with subclinical hypothyroidism with elevated TSH at 5.6 and free T4 of 1.1.  This may be a phase in a course of subacute thyroiditis swinging from transient thyrotoxicosis to  hypothyroidism. -I discussed 2 options with her including initiating low-dose thyroid hormone or watchful waiting for another 3 to 17-month without intervention. -She agrees with plan to watchful waiting.  She will return in 4 months with repeat TSH,  free T4, free T3. She is already on atenolol 25 mg p.o. daily which was originally started for treatment of hypertension.  She does not have clinical goiter, no need for thyroid imaging at this time.  Sharvi R  Lumadue participated in the discussions, expressed understanding, and voiced agreement with the above plans.  All questions were answered to her satisfaction. she is encouraged to contact clinic should she have any questions or concerns prior to her return visit.  Follow up plan: Return in about 4 months (around 09/20/2018) for follow up with pre-visit labs.   Glade Lloyd, MD [facility] [address] [place], [place] [address] Phone: [phone]  Fax: [phone]     05/20/2018, 1:53 PM  This note was partially dictated with voice recognition software. Similar sounding words can be transcribed inadequately or may not  be corrected upon review.

## 2018-09-22 ENCOUNTER — Ambulatory Visit: Payer: BLUE CROSS/BLUE SHIELD | Admitting: "Endocrinology

## 2018-09-22 DIAGNOSIS — Z23 Encounter for immunization: Secondary | ICD-10-CM | POA: Diagnosis not present

## 2018-10-28 DIAGNOSIS — E039 Hypothyroidism, unspecified: Secondary | ICD-10-CM | POA: Diagnosis not present

## 2018-10-28 LAB — T4, FREE: FREE T4: 1.1 ng/dL (ref 0.8–1.8)

## 2018-10-28 LAB — T3, FREE: T3 FREE: 3.1 pg/mL (ref 2.3–4.2)

## 2018-10-28 LAB — TSH: TSH: 3.1 mIU/L

## 2018-11-08 ENCOUNTER — Encounter: Payer: Self-pay | Admitting: "Endocrinology

## 2018-11-08 ENCOUNTER — Ambulatory Visit (INDEPENDENT_AMBULATORY_CARE_PROVIDER_SITE_OTHER): Payer: BLUE CROSS/BLUE SHIELD | Admitting: "Endocrinology

## 2018-11-08 VITALS — BP 131/76 | HR 80 | Ht 64.0 in | Wt 142.0 lb

## 2018-11-08 DIAGNOSIS — E039 Hypothyroidism, unspecified: Secondary | ICD-10-CM

## 2018-11-08 DIAGNOSIS — E038 Other specified hypothyroidism: Secondary | ICD-10-CM

## 2018-11-08 NOTE — Progress Notes (Signed)
Endocrinology follow-up note                                            11/08/2018, 12:39 PM   Subjective:    Patient ID: Michelle [name], female    DOB: December 17, 1965, PCP Sharilyn Sites, MD   Past Medical History:  Diagnosis Date  . Colon polyps    hyperplastic polyps  . GERD (gastroesophageal reflux disease)   . Hematochezia   . HTN (hypertension)   . IBS (irritable bowel syndrome)   . Thyroid disease    Past Surgical History:  Procedure Laterality Date  . COLONOSCOPY  09/16/09   RMR: hyperplastic polyps  . ESOPHAGOGASTRODUODENOSCOPY  08/13/06   hiatal hernia/mild gastritis  . FOOT SURGERY    . TONSILLECTOMY     Social History   Socioeconomic History  . Marital status: Married    Spouse name: Not on file  . Number of children: Not on file  . Years of education: Not on file  . Highest education level: Not on file  Occupational History  . Occupation: Data processing manager Asst At [employer]  . Financial resource strain: Not on file  . Food insecurity:    Worry: Not on file    Inability: Not on file  . Transportation needs:    Medical: Not on file    Non-medical: Not on file  Tobacco Use  . Smoking status: Never Smoker  . Smokeless tobacco: Never Used  Substance and Sexual Activity  . Alcohol use: No  . Drug use: No  . Sexual activity: Not on file  Lifestyle  . Physical activity:    Days per week: Not on file    Minutes per session: Not on file  . Stress: Not on file  Relationships  . Social connections:    Talks on phone: Not on file    Gets together: Not on file    Attends religious service: Not on file    Active member of club or organization: Not on file    Attends meetings of clubs or organizations: Not on file    Relationship status: Not on file  Other Topics Concern  . Not on file  Social History Narrative   Lives w/ husband   Outpatient Encounter Medications as of 11/08/2018  Medication Sig  . atenolol (TENORMIN) 25 MG tablet daily.   . diazepam (VALIUM) 2 MG tablet Take 2 mg by mouth 3 (three) times daily as needed.  Marland Kitchen omeprazole-sodium bicarbonate (ZEGERID) 40-1100 MG capsule Take 1 capsule by mouth daily as needed (for indigestion).   No facility-administered encounter medications on file as of 11/08/2018.    ALLERGIES: No Known Allergies  VACCINATION STATUS:  There is no immunization history on file for this patient.  HPI Michelle [name] is 53 y.o. female who presents today with repeat thyroid function tests after she was seen in consultation for abnormal thyroid function tests.   - She is  not currently on any antithyroid medications nor thyroid hormone supplements.   -Her history is significant for an episode of subacute thyroiditis with transient thyrotoxicosis followed by short period of  hypothyroidism which required injection of low-dose levothyroxine which was stopped prior to her last visit.  She presents with no new complaints.  She denies palpitations, heat intolerance, nor tremors.   -She has family history  of unidentified thyroid dysfunction in 1 of her sisters.  Denies any family history of thyroid cancer.  -She denies any history of being overweight or obese.  She is currently on atenolol 25 mg p.o. daily which was started originally for hypertension. -She denies dysphagia, odynophagia, no recent voice change.  Review of Systems  Constitutional: + Weight gain, no fatigue, no subjective hyperthermia nor hypothermia.   Eyes: no blurry vision, no xerophthalmia ENT: no sore throat, no nodules palpated in throat, no dysphagia/odynophagia, no hoarseness Cardiovascular: no Chest Pain, no Shortness of Breath, no palpitations, no leg swelling  Musculoskeletal: no muscle/joint aches Skin: no rashes Neurological: no tremors, no numbness, no tingling, no dizziness Psychiatric: no depression, no anxiety  Objective:    BP 131/76   Pulse 80   Ht 5\' 4"  (1.626 m)   Wt 142 lb (64.4 kg)   BMI 24.37 kg/m    Wt Readings from Last 3 Encounters:  11/08/18 142 lb (64.4 kg)  05/20/18 134 lb (60.8 kg)  05/02/18 134 lb (60.8 kg)    Physical Exam  Constitutional: + Appropriate weight for height, not in acute distress, normal state of mind Eyes: PERRLA, EOMI, no exophthalmos ENT: moist mucous membranes, no thyromegaly, no cervical lymphadenopathy  Musculoskeletal: no gross deformities, strength intact in all four extremities Skin: moist, warm, no rashes Neurological: no tremor with outstretched hands, Deep tendon reflexes normal in all four extremities.  CMP ( most recent) CMP     Component Value Date/Time   NA 142 01/25/2018 2217   NA 140 09/21/2011 1348   K 3.5 01/25/2018 2217   K 4.4 09/21/2011 1348   CL 104 01/25/2018 2217   CL 102 09/21/2011 1348   CO2 26 01/25/2018 2217   CO2 31 09/21/2011 1348   GLUCOSE 108 (H) 01/25/2018 2217   BUN 16 01/25/2018 2217   BUN 14 09/21/2011 1348   CREATININE 0.57 01/25/2018 2217   CREATININE 0.63 09/21/2011 1348   CALCIUM 9.6 01/25/2018 2217   CALCIUM 9.4 09/21/2011 1348   PROT 5.7 (L) 12/27/2008 0450   ALBUMIN 4.4 09/21/2011 1348   AST 23 09/21/2011 1348   ALT 23 09/21/2011 1348   ALKPHOS 76 09/21/2011 1348   BILITOT 0.5 09/21/2011 1348   GFRNONAA >60 01/25/2018 2217   GFRAA >60 01/25/2018 2217      Lab Results  Component Value Date   TSH 3.10 10/28/2018   TSH 5.65 (H) 05/02/2018   TSH 0.01 (A) 03/04/2018   TSH 0.002 (L) 01/25/2018   FREET4 1.1 10/28/2018   FREET4 1.1 05/02/2018    Mar 04, 2018 labs: TSH 0.014, total T4 8.9, free T3 2.1   Assessment & Plan:   1. Subclinical hypothyroidism- resolved -Her clinical course is most suggestive of subacute thyroiditis with transient thyrotoxicosis which has resolved.  Her previous thyroid function tests are within normal limits.  She would not require intervention with thyroid hormone nor with antithyroid medications.   -She will return in 1 year with repeat thyroid function  tests. She is already on atenolol 25 mg p.o. daily which was originally started for treatment of hypertension.  She does not have clinical goiter, no need for thyroid imaging at this time.  Follow up plan: Return in about 1 year (around 11/09/2019) for Follow up with Pre-visit Labs.   Glade Lloyd, MD [facility] [address] [place], [place] [address] Phone: [phone]  Fax: [phone]     11/08/2018, 12:39 PM  This note was partially dictated with voice recognition software. Similar sounding words can be transcribed inadequately or may not  be corrected upon review.

## 2019-02-02 DIAGNOSIS — I10 Essential (primary) hypertension: Secondary | ICD-10-CM | POA: Diagnosis not present

## 2019-08-22 ENCOUNTER — Encounter: Payer: Self-pay | Admitting: Internal Medicine

## 2019-08-24 DIAGNOSIS — Z23 Encounter for immunization: Secondary | ICD-10-CM | POA: Diagnosis not present

## 2019-11-06 ENCOUNTER — Telehealth: Payer: Self-pay | Admitting: "Endocrinology

## 2019-11-06 ENCOUNTER — Other Ambulatory Visit: Payer: Self-pay | Admitting: "Endocrinology

## 2019-11-06 DIAGNOSIS — E039 Hypothyroidism, unspecified: Secondary | ICD-10-CM

## 2019-11-06 DIAGNOSIS — E038 Other specified hypothyroidism: Secondary | ICD-10-CM

## 2019-11-06 NOTE — Telephone Encounter (Signed)
Can you update lab order °

## 2019-11-08 DIAGNOSIS — E039 Hypothyroidism, unspecified: Secondary | ICD-10-CM | POA: Diagnosis not present

## 2019-11-09 LAB — T4, FREE: Free T4: 1.1 ng/dL (ref 0.8–1.8)

## 2019-11-09 LAB — T3, FREE: T3, Free: 3.5 pg/mL (ref 2.3–4.2)

## 2019-11-09 LAB — TSH: TSH: 4.31 mIU/L

## 2019-11-13 ENCOUNTER — Ambulatory Visit (INDEPENDENT_AMBULATORY_CARE_PROVIDER_SITE_OTHER): Payer: BLUE CROSS/BLUE SHIELD | Admitting: "Endocrinology

## 2019-11-13 ENCOUNTER — Encounter: Payer: Self-pay | Admitting: "Endocrinology

## 2019-11-13 DIAGNOSIS — E039 Hypothyroidism, unspecified: Secondary | ICD-10-CM | POA: Diagnosis not present

## 2019-11-13 DIAGNOSIS — E038 Other specified hypothyroidism: Secondary | ICD-10-CM

## 2019-11-13 NOTE — Progress Notes (Signed)
11/13/2019, 10:42 AM                                Endocrinology Telehealth Visit Follow up Note -During COVID -19 Pandemic  I connected with Michelle [name] on 11/13/2019   by telephone and verified that I am speaking with the correct person using two identifiers. Michelle [name], 27-Dec-1965. she has verbally consented to this visit. All issues noted in this document were discussed and addressed. The format was not optimal for physical exam.   Subjective:    Patient ID: Michelle [name], female    DOB: 16-Jun-1966, PCP Sharilyn Sites, MD   Past Medical History:  Diagnosis Date  . Colon polyps    hyperplastic polyps  . GERD (gastroesophageal reflux disease)   . Hematochezia   . HTN (hypertension)   . IBS (irritable bowel syndrome)   . Thyroid disease    Past Surgical History:  Procedure Laterality Date  . COLONOSCOPY  09/16/09   RMR: hyperplastic polyps  . ESOPHAGOGASTRODUODENOSCOPY  08/13/06   hiatal hernia/mild gastritis  . FOOT SURGERY    . TONSILLECTOMY     Social History   Socioeconomic History  . Marital status: Married    Spouse name: Not on file  . Number of children: Not on file  . Years of education: Not on file  . Highest education level: Not on file  Occupational History  . Occupation: Administrative Asst At [employer]  . Smoking status: Never Smoker  . Smokeless tobacco: Never Used  Substance and Sexual Activity  . Alcohol use: No  . Drug use: No  . Sexual activity: Not on file  Other Topics Concern  . Not on file  Social History Narrative   Lives w/ husband   Social Determinants of Health   Financial Resource Strain:   . Difficulty of Paying Living Expenses: Not on file  Food Insecurity:   . Worried About Charity fundraiser in the Last Year: Not on file  . Ran Out of Food in the Last Year: Not on file  Transportation Needs:   . Lack of Transportation (Medical): Not on file  . Lack of  Transportation (Non-Medical): Not on file  Physical Activity:   . Days of Exercise per Week: Not on file  . Minutes of Exercise per Session: Not on file  Stress:   . Feeling of Stress : Not on file  Social Connections:   . Frequency of Communication with Friends and Family: Not on file  . Frequency of Social Gatherings with Friends and Family: Not on file  . Attends Religious Services: Not on file  . Active Member of Clubs or Organizations: Not on file  . Attends Archivist Meetings: Not on file  . Marital Status: Not on file   Outpatient Encounter Medications as of 11/13/2019  Medication Sig  . atenolol (TENORMIN) 25 MG tablet daily.  . diazepam (VALIUM) 2 MG tablet Take 2 mg by mouth 3 (three) times daily as needed.  Marland Kitchen omeprazole-sodium bicarbonate (ZEGERID) 40-1100 MG capsule Take 1 capsule by mouth daily as needed (for indigestion).   No facility-administered  encounter medications on file as of 11/13/2019.   ALLERGIES: No Known Allergies  VACCINATION STATUS:  There is no immunization history on file for this patient.  HPI Michelle [name] is 54 y.o. female who is being engaged in telehealth via telephone in follow-up after she was seen in consultation for abnormal thyroid function tests.  - She is  not currently on any antithyroid medications nor thyroid hormone supplements.   -Her history is significant for an episode of subacute thyroiditis with transient thyrotoxicosis followed by short period of  hypothyroidism which required treatment with low-dose levothyroxine which was stopped prior to her last visit.    She has no new complaints today.  She has steady body weight.   -She has family history of unidentified thyroid dysfunction in 1 of her sisters.  Denies any family history of thyroid cancer.  -She denies any history of being overweight or obese.  She is currently on atenolol 25 mg p.o. daily which was started originally for hypertension. -She denies dysphagia,  odynophagia, no recent voice change.  Review of Systems Limited as above.  Objective:    There were no vitals taken for this visit.  Wt Readings from Last 3 Encounters:  11/08/18 142 lb (64.4 kg)  05/20/18 134 lb (60.8 kg)  05/02/18 134 lb (60.8 kg)    Physical Exam  CMP     Component Value Date/Time   NA 142 01/25/2018 2217   NA 140 09/21/2011 1348   K 3.5 01/25/2018 2217   K 4.4 09/21/2011 1348   CL 104 01/25/2018 2217   CL 102 09/21/2011 1348   CO2 26 01/25/2018 2217   CO2 31 09/21/2011 1348   GLUCOSE 108 (H) 01/25/2018 2217   BUN 16 01/25/2018 2217   BUN 14 09/21/2011 1348   CREATININE 0.57 01/25/2018 2217   CREATININE 0.63 09/21/2011 1348   CALCIUM 9.6 01/25/2018 2217   CALCIUM 9.4 09/21/2011 1348   PROT 5.7 (L) 12/27/2008 0450   ALBUMIN 4.4 09/21/2011 1348   AST 23 09/21/2011 1348   ALT 23 09/21/2011 1348   ALKPHOS 76 09/21/2011 1348   BILITOT 0.5 09/21/2011 1348   GFRNONAA >60 01/25/2018 2217   GFRAA >60 01/25/2018 2217      Lab Results  Component Value Date   TSH 4.31 11/08/2019   TSH 3.10 10/28/2018   TSH 5.65 (H) 05/02/2018   TSH 0.01 (A) 03/04/2018   TSH 0.002 (L) 01/25/2018   FREET4 1.1 11/08/2019   FREET4 1.1 10/28/2018   FREET4 1.1 05/02/2018      Assessment & Plan:   1. Subclinical hypothyroidism- resolved -Her clinical course is most suggestive of subacute thyroiditis with transient thyrotoxicosis which has resolved.  Her previsit thyroid function tests are within normal limits.  She will not require any intervention with thyroid hormone nor antithyroid measures.    -She will return in 1 year with repeat thyroid function tests. She is already on atenolol 25 mg p.o. daily which was originally started for treatment of hypertension.  She does not have clinical goiter, no need for thyroid imaging at this time.     - Time spent on this patient care encounter:  25 minutes of which 50% was spent in  counseling and the rest reviewing  her  current and  previous labs / studies and medications  doses and developing a plan for long term care. Geniva R Nason  participated in the discussions, expressed understanding, and voiced agreement with the above plans.  All  questions were answered to her satisfaction. she is encouraged to contact clinic should she have any questions or concerns prior to her return visit.  Follow up plan: Return in about 1 year (around 11/12/2020) for Follow up with Pre-visit Labs.   Glade Lloyd, MD [facility] [address] [place], [place] [address] Phone: [phone]  Fax: [phone]     11/13/2019, 10:42 AM  This note was partially dictated with voice recognition software. Similar sounding words can be transcribed inadequately or may not  be corrected upon review.

## 2020-03-04 IMAGING — DX DG CHEST 2V
2 series · 2 of 2 positions shown · non-contrast
Comparison: None.

CLINICAL DATA: 52-year-old female with palpitation.

EXAM:
CHEST - 2 VIEW

[chest pa]
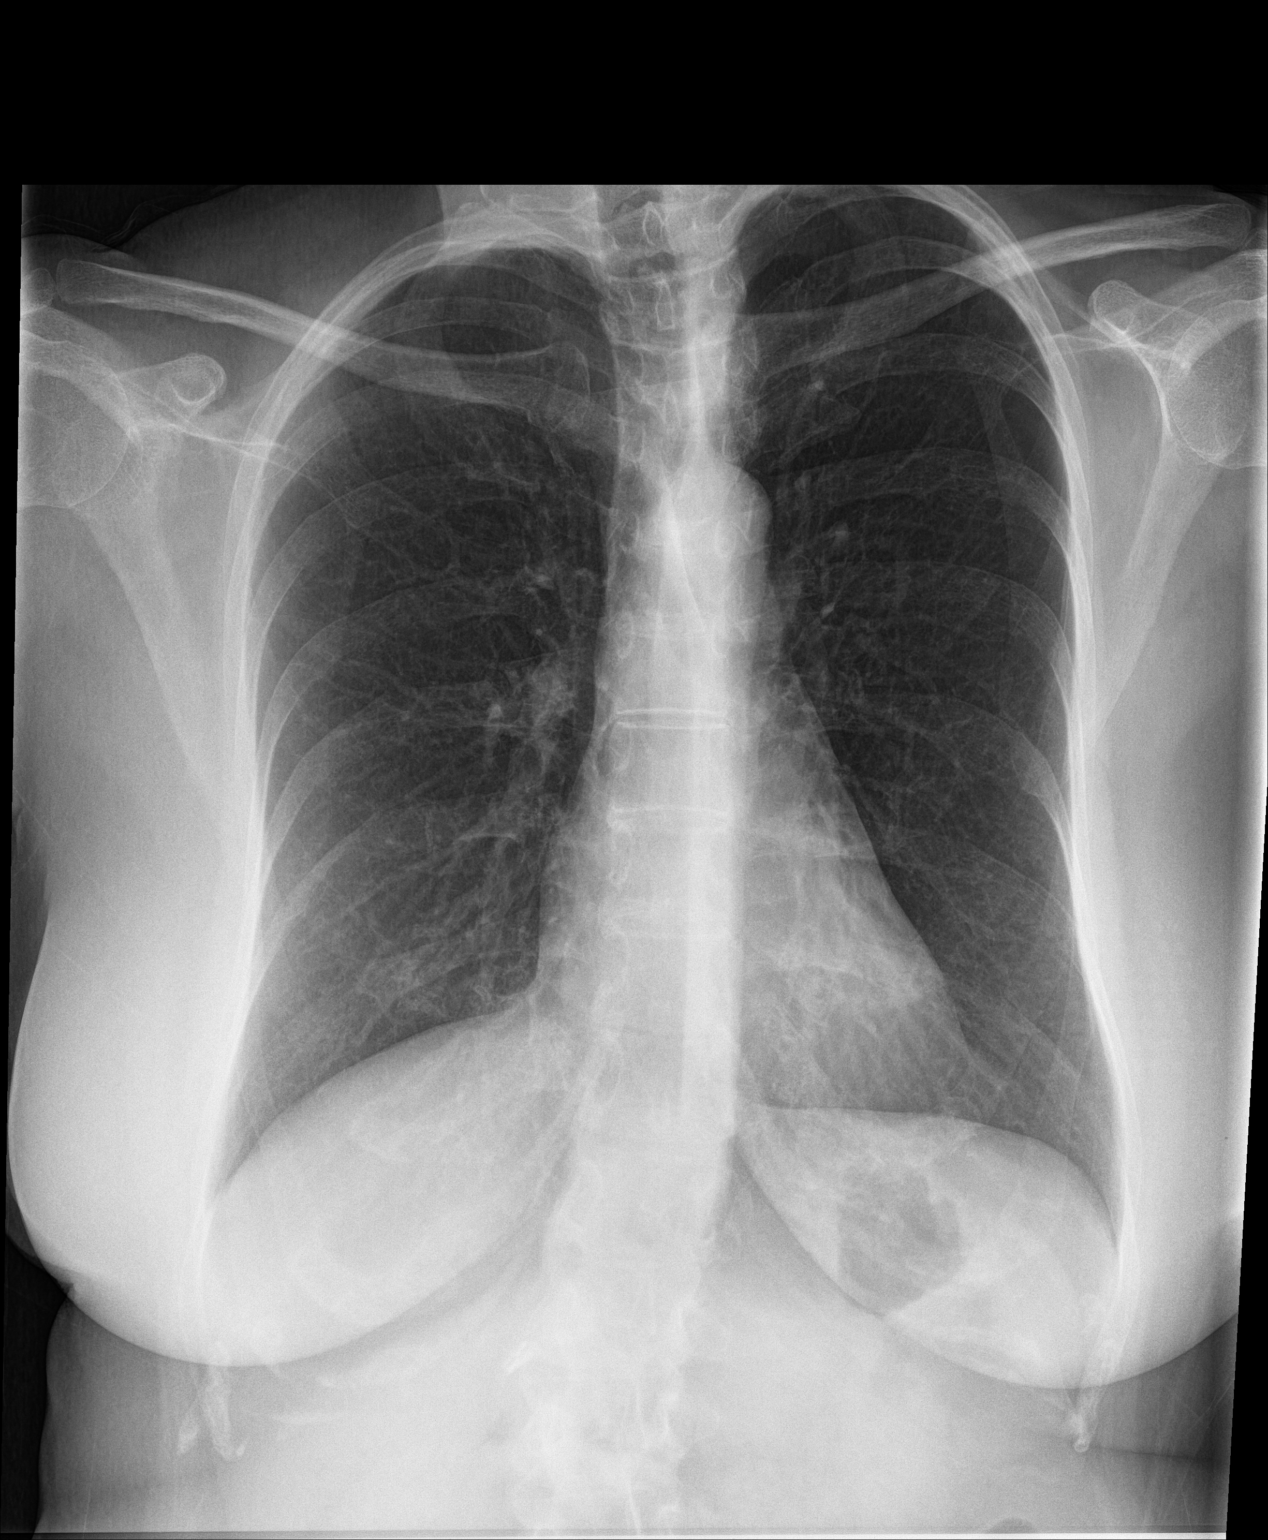

[chest lat]
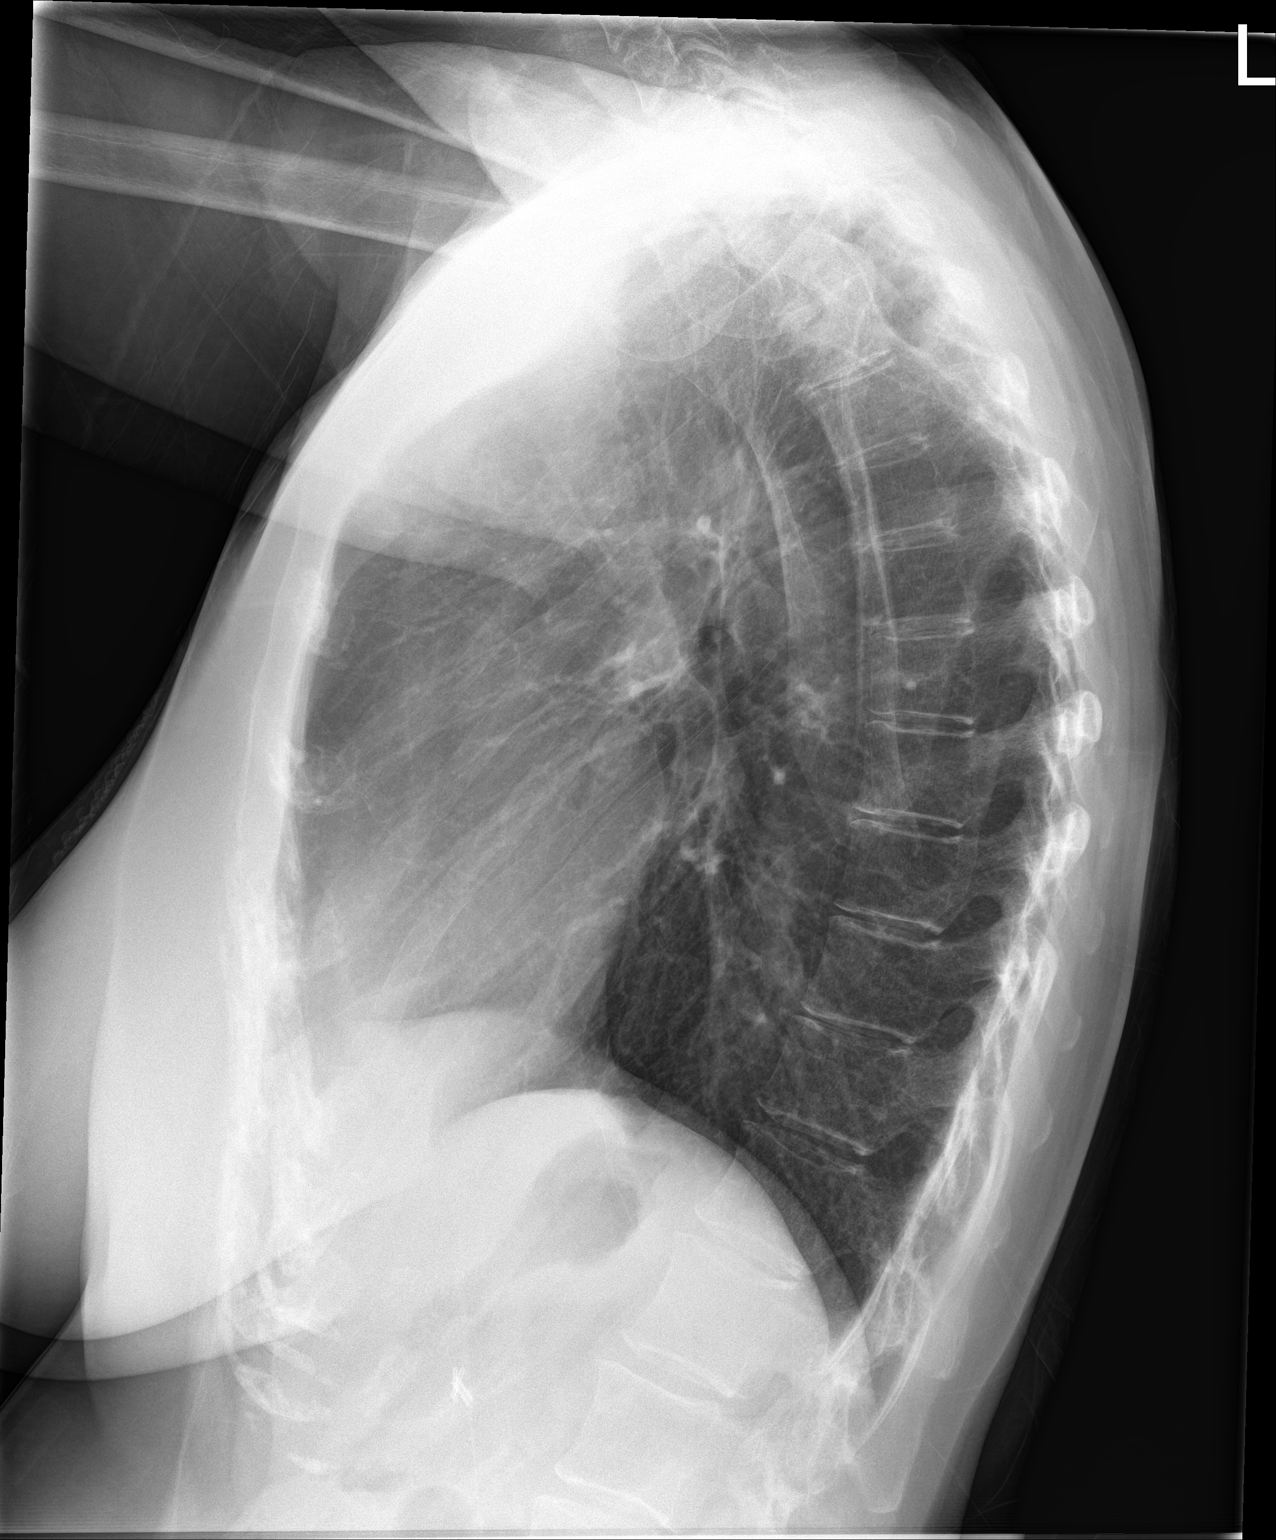

[2 of 2 positions shown; findings below may reference images not displayed]

FINDINGS: Mild chronic bronchitic changes. No focal consolidation, pleural
effusion, or pneumothorax. The cardiac silhouette is within normal
limits. No acute osseous pathology.
IMPRESSION: No active cardiopulmonary disease.

## 2020-03-12 ENCOUNTER — Encounter: Payer: Self-pay | Admitting: Internal Medicine

## 2020-04-02 ENCOUNTER — Ambulatory Visit (INDEPENDENT_AMBULATORY_CARE_PROVIDER_SITE_OTHER): Payer: BC Managed Care – PPO | Admitting: Adult Health

## 2020-04-02 ENCOUNTER — Encounter: Payer: Self-pay | Admitting: Adult Health

## 2020-04-02 ENCOUNTER — Other Ambulatory Visit (HOSPITAL_COMMUNITY)
Admission: RE | Admit: 2020-04-02 | Discharge: 2020-04-02 | Disposition: A | Discharge status: Home / Self Care | Payer: BC Managed Care – PPO | Source: Ambulatory Visit | Attending: Adult Health | Admitting: Adult Health

## 2020-04-02 VITALS — BP 141/73 | HR 87 | Ht 64.5 in | Wt 145.8 lb

## 2020-04-02 DIAGNOSIS — R140 Abdominal distension (gaseous): Secondary | ICD-10-CM | POA: Diagnosis not present

## 2020-04-02 DIAGNOSIS — Z01419 Encounter for gynecological examination (general) (routine) without abnormal findings: Secondary | ICD-10-CM

## 2020-04-02 DIAGNOSIS — Z1211 Encounter for screening for malignant neoplasm of colon: Secondary | ICD-10-CM | POA: Diagnosis not present

## 2020-04-02 DIAGNOSIS — Z1212 Encounter for screening for malignant neoplasm of rectum: Secondary | ICD-10-CM

## 2020-04-02 DIAGNOSIS — Z1272 Encounter for screening for malignant neoplasm of vagina: Secondary | ICD-10-CM

## 2020-04-02 DIAGNOSIS — N3281 Overactive bladder: Secondary | ICD-10-CM | POA: Diagnosis not present

## 2020-04-02 LAB — HEMOCCULT GUIAC POC 1CARD (OFFICE): Fecal Occult Blood, POC: NEGATIVE

## 2020-04-02 NOTE — Patient Instructions (Signed)

## 2020-04-02 NOTE — Progress Notes (Signed)
Patient ID: LATIFA VOORHIS, female   DOB: Feb 15, 1966, 54 y.o.   MRN: [id number] History of Present Illness: Shauntee is a 54 year old white female,married, PM in for well woman gyn exam and pap.She is a new pt.  PCP is Dr Hilma Favors.    Current Medications, Allergies, Past Medical History, Past Surgical History, Family History and Social History were reviewed in [employer] record.     Review of Systems: Patient denies any headaches, hearing loss, fatigue, blurred vision, shortness of breath, chest pain, problems with bowel movements, or intercourse. No joint pain or mood swings. Has bloating and ?IBS Has to pee about every hour Has pain in left side that radiates up when has orgasm, has negative [place] in 2019 So this is not new occurrence   Physical Exam:BP (!) 141/73 (BP Location: Left Arm, Patient Position: Sitting, Cuff Size: Normal)   Pulse 87   Ht 5' 4.5" (1.638 m)   Wt 145 lb 12.8 oz (66.1 kg)   BMI 24.64 kg/m  General:  Well developed, well nourished, no acute distress Skin:  Warm and dry Neck:  Midline trachea, normal thyroid, good ROM, no lymphadenopathy Lungs; Clear to auscultation bilaterally Breast:  No dominant palpable mass, retraction, or nipple discharge Cardiovascular: Regular rate and rhythm Abdomen:  Soft, non tender, no hepatosplenomegaly Pelvic:  External genitalia is normal in appearance, no lesions.  The vagina is pale with loss of moisture and rugae.Marland Kitchen Urethra has no lesions or masses. The cervix is smooth, no CMT, pap with high risk HPV 16/18 genotyping performed.  Uterus is felt to be normal size, shape, and contour.  No adnexal masses or tenderness noted.Bladder is non tender, no masses felt. Rectal: Good sphincter tone, no polyps, or hemorrhoids felt.  Hemoccult negative. Extremities/musculoskeletal:  No swelling or varicosities noted, no clubbing or cyanosis Psych:  No mood changes, alert and cooperative,seems happy AA 0 Fall risk is low PHQ  9 score is 1, no SI Co exam with Terri Skains NP student.  Impression and plan: 1. Encounter for gynecological examination with Papanicolaou smear of cervix Pap sent Physical in 1 year Pap in 3 if normal Mammogram yearly, can get at [facility], just get records sent over from the [facility] with PCP  With next orgasm note if feels like spasm  2. Screening for colorectal cancer Colonoscopy due on August with Dr Gala Romney  3. OAB (overactive bladder) Decrease caffeine Try kegel's Handout given, if not better can try oxybutynin   4. Bloating Try metamucil and avoid known foods to trigger

## 2020-04-04 LAB — CYTOLOGY - PAP
Comment: NEGATIVE
Diagnosis: NEGATIVE
High risk HPV: NEGATIVE

## 2020-06-04 ENCOUNTER — Other Ambulatory Visit: Payer: Self-pay

## 2020-06-04 ENCOUNTER — Ambulatory Visit (INDEPENDENT_AMBULATORY_CARE_PROVIDER_SITE_OTHER): Payer: Self-pay | Admitting: *Deleted

## 2020-06-04 DIAGNOSIS — Z1211 Encounter for screening for malignant neoplasm of colon: Secondary | ICD-10-CM

## 2020-06-04 NOTE — Progress Notes (Signed)
Pt aware that I will call her once Sept procedure schedules are available.

## 2020-06-04 NOTE — Progress Notes (Addendum)
Gastroenterology Pre-Procedure Review  Request Date: 06/04/2020 Requesting Physician: 10 year recall, Last TCS done 09/16/2009 by Dr. Gala Romney, hyperplastic polyp  PATIENT REVIEW QUESTIONS: The patient responded to the following health history questions as indicated:    1. Diabetes Melitis: no 2. Joint replacements in the past 12 months: no 3. Major health problems in the past 3 months: no 4. Has an artificial valve or MVP: no 5. Has a defibrillator: no 6. Has been advised in past to take antibiotics in advance of a procedure like teeth cleaning: no 7. Family history of colon cancer: no  8. Alcohol Use: no 9. Illicit drug Use: no 10. History of sleep apnea: no  11. History of coronary artery or other vascular stents placed within the last 12 months: no 12. History of any prior anesthesia complications: no 13. There is no height or weight on file to calculate BMI. ht: 5'4 wt: 144 lbs    MEDICATIONS & ALLERGIES:    Patient reports the following regarding taking any blood thinners:   Plavix? no Aspirin? no Coumadin? no Brilinta? no Xarelto? no Eliquis? no Pradaxa? no Savaysa? no Effient? no  Patient confirms/reports the following medications:  Current Outpatient Medications  Medication Sig Dispense Refill  . atenolol (TENORMIN) 25 MG tablet daily.    . calcium carbonate (TUMS - DOSED IN MG ELEMENTAL CALCIUM) 500 MG chewable tablet Chew 1 tablet by mouth as needed for indigestion or heartburn. Takes tums on days that she doesn't take Zegerid.    . diazepam (VALIUM) 2 MG tablet Take 2 mg by mouth as needed.   1  . Multiple Vitamins-Minerals (MULTIVITAMIN WOMEN 50+ PO) Take by mouth daily.    Marland Kitchen omeprazole-sodium bicarbonate (ZEGERID) 40-1100 MG capsule Take 1 capsule by mouth daily as needed (for indigestion).     No current facility-administered medications for this visit.    Patient confirms/reports the following allergies:  No Known Allergies  No orders of the defined types  were placed in this encounter.   AUTHORIZATION INFORMATION Primary Insurance: [place],  [place] #: [id number],  Group #: [id number] Pre-Cert / Josem Kaufmann required: No, not required per Luther Parody / Auth #: Ref# [id number]  SCHEDULE INFORMATION: Procedure has been scheduled as follows:  Date:07/17/2020 , Time: 10:15 Location: [facility] with Dr. Gala Romney  This Gastroenterology Pre-Precedure Review Form is being routed to the following provider(s): Roseanne Kaufman, NP

## 2020-06-05 NOTE — Progress Notes (Signed)
Appropriate, ASA II

## 2020-06-06 ENCOUNTER — Telehealth: Payer: Self-pay | Admitting: *Deleted

## 2020-06-06 MED ORDER — PEG 3350-KCL-NA BICARB-NACL 420 G PO SOLR: 4000.0000 mL | Freq: Once | ORAL | 0 refills | Status: AC

## 2020-06-06 NOTE — Addendum Note (Signed)
Addended by: Metro Kung on: 06/06/2020 01:29 PM   Modules accepted: Orders

## 2020-06-06 NOTE — Patient Instructions (Signed)
Michelle [name]   Sep 02, 1966 MRN: [id number]    Procedure Date: 07/17/2020 Time to register: 9:15 am Place to register: [facility] Procedure Time: 10:15 am Scheduled provider: Dr. Gala Romney  PREPARATION FOR COLONOSCOPY WITH TRI-LYTE SPLIT PREP  Please notify us immediately if you are diabetic, take iron supplements, or if you are on Coumadin or any other blood thinners.   Please hold the following medications: n/a  You will need to purchase 1 fleet enema and 1 box of Bisacodyl '5mg'$  tablets.   2 DAYS BEFORE PROCEDURE:  DATE: 07/15/2020   DAY: Monday Begin clear liquid diet AFTER your lunch meal. NO SOLID FOODS after this point.  1 DAY BEFORE PROCEDURE:  DATE: 07/16/2020   DAY: Tuesday Continue clear liquids the entire day - NO SOLID FOOD.   Diabetic medications adjustments for today: n/a  At 2:00 pm:  Take 2 Bisacodyl tablets.   At 4:00pm:  Start drinking your solution. Make sure you mix well per instructions on the bottle. Try to drink 1 (one) 8 ounce glass every 10-15 minutes until you have consumed HALF the jug. You should complete by 6:00pm.You must keep the left over solution refrigerated until completed next day.  Continue clear liquids. You must drink plenty of clear liquids to prevent dehyration and kidney failure.     DAY OF PROCEDURE:   DATE: 07/17/2020   DAY: Wednesday If you take medications for your heart, blood pressure or breathing, you may take these medications.  Diabetic medications adjustments for today: n/a  Five hours before your procedure time @ 5:15 am:  Finish remaining amout of bowel prep, drinking 1 (one) 8 ounce glass every 10-15 minutes until complete. You have two hours to consume remaining prep.   Three hours before your procedure time @ 7:15 am:  Nothing by mouth.   At least one hour before going to the hospital:  Give yourself one Fleet enema. You may take your morning medications with sip of water unless we have instructed otherwise.       Please see below for Dietary Information.  CLEAR LIQUIDS INCLUDE:  Water Jello (NOT red in color)   Ice Popsicles (NOT red in color)   Tea (sugar ok, no milk/cream) Powdered fruit flavored drinks  Coffee (sugar ok, no milk/cream) Gatorade/ Lemonade/ Kool-Aid  (NOT red in color)   Juice: apple, white grape, white cranberry Soft drinks  Clear bullion, consomme, broth (fat free beef/chicken/vegetable)  Carbonated beverages (any kind)  Strained chicken noodle soup Hard Candy   Remember: Clear liquids are liquids that will allow you to see your fingers on the other side of a clear glass. Be sure liquids are NOT red in color, and not cloudy, but CLEAR.  DO NOT EAT OR DRINK ANY OF THE FOLLOWING:  Dairy products of any kind   Cranberry juice Tomato juice / V8 juice   Grapefruit juice Orange juice     Red grape juice  Do not eat any solid foods, including such foods as: cereal, oatmeal, yogurt, fruits, vegetables, creamed soups, eggs, bread, crackers, pureed foods in a blender, etc.   HELPFUL HINTS FOR DRINKING PREP SOLUTION:   Make sure prep is extremely cold. Mix and refrigerate the the morning of the prep. You may also put in the freezer.   You may try mixing some Crystal Light or Country Time Lemonade if you prefer. Mix in small amounts; add more if necessary.  Try drinking through a straw  Rinse mouth with water  or a mouthwash between glasses, to remove after-taste.  Try sipping on a cold beverage /ice/ popsicles between glasses of prep.  Place a piece of sugar-free hard candy in mouth between glasses.  If you become nauseated, try consuming smaller amounts, or stretch out the time between glasses. Stop for 30-60 minutes, then slowly start back drinking.        OTHER INSTRUCTIONS  You will need a responsible adult at least 56 years of age to accompany you and drive you home. This person must remain in the waiting room during your procedure. The hospital will cancel  your procedure if you do not have a responsible adult with you.   1. Wear loose fitting clothing that is easily removed. 2. Leave jewelry and other valuables at home.  3. Remove all body piercing jewelry and leave at home. 4. Total time from sign-in until discharge is approximately 2-3 hours. 5. You should go home directly after your procedure and rest. You can resume normal activities the day after your procedure. 6. The day of your procedure you should not:  Drive  Make legal decisions  Operate machinery  Drink alcohol  Return to work   You may call the office (Dept: [phone]) before 5:00pm, or page the doctor on call [phone]) after 5:00pm, for further instructions, if necessary.   Insurance Information YOU WILL NEED TO CHECK WITH YOUR INSURANCE COMPANY FOR THE BENEFITS OF COVERAGE YOU HAVE FOR THIS PROCEDURE.  UNFORTUNATELY, NOT ALL INSURANCE COMPANIES HAVE BENEFITS TO COVER ALL OR PART OF THESE TYPES OF PROCEDURES.  IT IS YOUR RESPONSIBILITY TO CHECK YOUR BENEFITS, HOWEVER, WE WILL BE GLAD TO ASSIST YOU WITH ANY CODES YOUR INSURANCE COMPANY MAY NEED.    PLEASE NOTE THAT MOST INSURANCE COMPANIES WILL NOT COVER A SCREENING COLONOSCOPY FOR PEOPLE UNDER THE AGE OF 50  IF YOU HAVE BCBS INSURANCE, YOU MAY HAVE BENEFITS FOR A SCREENING COLONOSCOPY BUT IF POLYPS ARE FOUND THE DIAGNOSIS WILL CHANGE AND THEN YOU MAY HAVE A DEDUCTIBLE THAT WILL NEED TO BE MET. SO PLEASE MAKE SURE YOU CHECK YOUR BENEFITS FOR A SCREENING COLONOSCOPY AS WELL AS A DIAGNOSTIC COLONOSCOPY.

## 2020-06-06 NOTE — Telephone Encounter (Signed)
Lmom for pt to call me back to schedule procedure.

## 2020-06-10 ENCOUNTER — Other Ambulatory Visit: Payer: Self-pay | Admitting: *Deleted

## 2020-07-15 ENCOUNTER — Other Ambulatory Visit: Payer: Self-pay

## 2020-07-15 ENCOUNTER — Other Ambulatory Visit (HOSPITAL_COMMUNITY)
Admission: RE | Admit: 2020-07-15 | Discharge: 2020-07-15 | Disposition: A | Discharge status: Home / Self Care | Payer: BC Managed Care – PPO | Source: Ambulatory Visit | Attending: Internal Medicine | Admitting: Internal Medicine

## 2020-07-15 DIAGNOSIS — Z01812 Encounter for preprocedural laboratory examination: Secondary | ICD-10-CM | POA: Diagnosis not present

## 2020-07-15 DIAGNOSIS — Z20822 Contact with and (suspected) exposure to covid-19: Secondary | ICD-10-CM | POA: Diagnosis not present

## 2020-07-15 DIAGNOSIS — Z1231 Encounter for screening mammogram for malignant neoplasm of breast: Secondary | ICD-10-CM | POA: Diagnosis not present

## 2020-07-16 LAB — SARS CORONAVIRUS 2 (TAT 6-24 HRS): SARS Coronavirus 2: NEGATIVE

## 2020-07-17 ENCOUNTER — Other Ambulatory Visit: Payer: Self-pay

## 2020-07-17 ENCOUNTER — Encounter (HOSPITAL_COMMUNITY): Payer: Self-pay | Admitting: Internal Medicine

## 2020-07-17 ENCOUNTER — Ambulatory Visit (HOSPITAL_COMMUNITY)
Admission: RE | Admit: 2020-07-17 | Discharge: 2020-07-17 | Disposition: A | Discharge status: Home / Self Care | Payer: BC Managed Care – PPO | Attending: Internal Medicine | Admitting: Internal Medicine

## 2020-07-17 ENCOUNTER — Encounter (HOSPITAL_COMMUNITY)
Admission: RE | Disposition: A | Discharge status: Home / Self Care | Payer: Self-pay | Source: Home / Self Care | Attending: Internal Medicine

## 2020-07-17 DIAGNOSIS — K219 Gastro-esophageal reflux disease without esophagitis: Secondary | ICD-10-CM | POA: Insufficient documentation

## 2020-07-17 DIAGNOSIS — K589 Irritable bowel syndrome without diarrhea: Secondary | ICD-10-CM | POA: Insufficient documentation

## 2020-07-17 DIAGNOSIS — Z8249 Family history of ischemic heart disease and other diseases of the circulatory system: Secondary | ICD-10-CM | POA: Diagnosis not present

## 2020-07-17 DIAGNOSIS — Z8349 Family history of other endocrine, nutritional and metabolic diseases: Secondary | ICD-10-CM | POA: Insufficient documentation

## 2020-07-17 DIAGNOSIS — I10 Essential (primary) hypertension: Secondary | ICD-10-CM | POA: Diagnosis not present

## 2020-07-17 DIAGNOSIS — Z86010 Personal history of colonic polyps: Secondary | ICD-10-CM | POA: Insufficient documentation

## 2020-07-17 DIAGNOSIS — E079 Disorder of thyroid, unspecified: Secondary | ICD-10-CM | POA: Diagnosis not present

## 2020-07-17 DIAGNOSIS — Z833 Family history of diabetes mellitus: Secondary | ICD-10-CM | POA: Diagnosis not present

## 2020-07-17 DIAGNOSIS — Z9049 Acquired absence of other specified parts of digestive tract: Secondary | ICD-10-CM | POA: Insufficient documentation

## 2020-07-17 DIAGNOSIS — Z79899 Other long term (current) drug therapy: Secondary | ICD-10-CM | POA: Diagnosis not present

## 2020-07-17 DIAGNOSIS — Z1211 Encounter for screening for malignant neoplasm of colon: Secondary | ICD-10-CM | POA: Diagnosis not present

## 2020-07-17 HISTORY — PX: COLONOSCOPY: SHX5424

## 2020-07-17 SURGERY — COLONOSCOPY
Anesthesia: Moderate Sedation

## 2020-07-17 MED ORDER — MEPERIDINE HCL 50 MG/ML IJ SOLN
INTRAMUSCULAR | Status: AC
  Filled 2020-07-17: qty 1

## 2020-07-17 MED ORDER — MIDAZOLAM HCL 5 MG/5ML IJ SOLN
INTRAMUSCULAR | Status: AC
  Filled 2020-07-17: qty 10

## 2020-07-17 MED ORDER — ONDANSETRON HCL 4 MG/2ML IJ SOLN
INTRAMUSCULAR | Status: DC | PRN
  Administered 2020-07-17: 4 mg via INTRAVENOUS

## 2020-07-17 MED ORDER — ONDANSETRON HCL 4 MG/2ML IJ SOLN
INTRAMUSCULAR | Status: AC
  Filled 2020-07-17: qty 2

## 2020-07-17 MED ORDER — MIDAZOLAM HCL 5 MG/5ML IJ SOLN
INTRAMUSCULAR | Status: DC | PRN
  Administered 2020-07-17: 1 mg via INTRAVENOUS
  Administered 2020-07-17: 2 mg via INTRAVENOUS
  Administered 2020-07-17 (×2): 1 mg via INTRAVENOUS
  Administered 2020-07-17: 2 mg via INTRAVENOUS

## 2020-07-17 MED ORDER — MEPERIDINE HCL 100 MG/ML IJ SOLN
INTRAMUSCULAR | Status: DC | PRN
  Administered 2020-07-17 (×2): 25 mg via INTRAVENOUS

## 2020-07-17 MED ORDER — SODIUM CHLORIDE 0.9 % IV SOLN
INTRAVENOUS | Status: DC
  Administered 2020-07-17: 1000 mL via INTRAVENOUS

## 2020-07-17 MED ORDER — STERILE WATER FOR IRRIGATION IR SOLN
Status: DC | PRN
  Administered 2020-07-17: 1.5 mL

## 2020-07-17 NOTE — H&P (Signed)
@LOGO @   Primary Care Physician:  Sharilyn Sites, MD Primary Gastroenterologist:  Dr. Gala Romney  Pre-Procedure History & Physical: HPI:  Michelle [name] is a 54 y.o. female is here for a screening colonoscopy.  No bowel symptoms.  No family history of colon cancer.  Negative colonoscopy 10 years ago  Past Medical History:  Diagnosis Date  . Colon polyps    hyperplastic polyps  . GERD (gastroesophageal reflux disease)   . Hematochezia   . HTN (hypertension)   . IBS (irritable bowel syndrome)   . Thyroid disease     Past Surgical History:  Procedure Laterality Date  . CHOLECYSTECTOMY    . COLONOSCOPY  09/16/09   RMR: hyperplastic polyps  . ESOPHAGOGASTRODUODENOSCOPY  08/13/06   hiatal hernia/mild gastritis  . FOOT SURGERY    . TONSILLECTOMY      Prior to Admission medications   Medication Sig Start Date End Date Taking? Authorizing Provider  atenolol (TENORMIN) 25 MG tablet Take 25 mg by mouth daily.  04/28/18  Yes [provider]  Brimonidine Tartrate (LUMIFY) 0.025 % SOLN Place 1 drop into both eyes daily as needed (red eyes).   Yes [provider]  calcium carbonate (TUMS - DOSED IN MG ELEMENTAL CALCIUM) 500 MG chewable tablet Chew 2 tablets by mouth at bedtime. Takes tums on days that she doesn't take Zegerid.    Yes [provider]  ketotifen (ALLERGY EYE DROPS) 0.025 % ophthalmic solution Place 1 drop into both eyes daily.   Yes [provider]  Multiple Vitamins-Minerals (MULTIVITAMIN WOMEN 50+ PO) Take 1 tablet by mouth daily.    Yes [provider]  Omeprazole-Sodium Bicarbonate (ZEGERID) 20-1100 MG CAPS capsule Take 1 capsule by mouth 2 (two) times a week.    Yes [provider]  oxymetazoline (AFRIN) 0.05 % nasal spray Place 1 spray into both nostrils daily as needed for congestion.   Yes [provider]  diazepam (VALIUM) 2 MG tablet Take 2 mg by mouth daily as needed for sedation.  02/01/18   [provider]    Allergies as of 06/06/2020  . (No Known Allergies)    Family History  Problem Relation Age of Onset  . Thyroid disease Sister   . Diabetes Maternal Grandfather   . Diabetes Father   . Heart disease Mother   . Diabetes Mother   . Colon cancer Neg Hx     Social History   Socioeconomic History  . Marital status: Married    Spouse name: Not on file  . Number of children: Not on file  . Years of education: Not on file  . Highest education level: Not on file  Occupational History  . Occupation: Administrative Asst At [employer]  . Smoking status: Never Smoker  . Smokeless tobacco: Never Used  Vaping Use  . Vaping Use: Never used  Substance and Sexual Activity  . Alcohol use: No  . Drug use: No  . Sexual activity: Yes    Birth control/protection: Post-menopausal  Other Topics Concern  . Not on file  Social History Narrative   Lives w/ husband   Social Determinants of Health   Financial Resource Strain: Low Risk   . Difficulty of Paying Living Expenses: Not hard at all  Food Insecurity: No Food Insecurity  . Worried About Charity fundraiser in the Last Year: Never true  . Ran Out of Food in the Last Year: Never true  Transportation Needs: No Transportation  Needs  . Lack of Transportation (Medical): No  . Lack of Transportation (Non-Medical): No  Physical Activity: Insufficiently Active  . Days of Exercise per Week: 3 days  . Minutes of Exercise per Session: 40 min  Stress: No Stress Concern Present  . Feeling of Stress : Only a little  Social Connections: Moderately Integrated  . Frequency of Communication with Friends and Family: Twice a week  . Frequency of Social Gatherings with Friends and Family: Once a week  . Attends Religious Services: More than 4 times per year  . Active Member of Clubs or Organizations: No  . Attends Archivist Meetings: Never  . Marital Status: Married  Human resources officer Violence: Not At Risk  .  Fear of Current or Ex-Partner: No  . Emotionally Abused: No  . Physically Abused: No  . Sexually Abused: No    Review of Systems: See HPI, otherwise negative ROS  Physical Exam: BP (!) 151/76   Pulse (!) 105 Comment: patient states that she is very nervous  Temp 97.7 F (36.5 C) (Oral)   Resp 19   Ht 5\' 4"  (1.626 m)   Wt 63.5 kg   SpO2 99%   BMI 24.03 kg/m  General:   Alert,  Well-developed, well-nourished, pleasant and cooperative in NAD Neck:  Supple; no masses or thyromegaly. Lungs:  Clear throughout to auscultation.   No wheezes, crackles, or rhonchi. No acute distress. Heart:  Regular rate and rhythm; no murmurs, clicks, rubs,  or gallops. Abdomen:  Soft, nontender and nondistended. No masses, hepatosplenomegaly or hernias noted. Normal bowel sounds, without guarding, and without rebound.   Impression/Plan: Michelle [name] is now here to undergo a screening colonoscopy.  Average risk screening examination.  Risks, benefits, limitations, imponderables and alternatives regarding colonoscopy have been reviewed with the patient. Questions have been answered. All parties agreeable.     Notice:  This dictation was prepared with Dragon dictation along with smaller phrase technology. Any transcriptional errors that result from this process are unintentional and may not be corrected upon review.

## 2020-07-17 NOTE — Discharge Instructions (Signed)
Colonoscopy Discharge Instructions  Read the instructions outlined below and refer to this sheet in the next few weeks. These discharge instructions provide you with general information on caring for yourself after you leave the hospital. Your doctor may also give you specific instructions. While your treatment has been planned according to the most current medical practices available, unavoidable complications occasionally occur. If you have any problems or questions after discharge, call Dr. Gala Romney at [phone]. ACTIVITY  You may resume your regular activity, but move at a slower pace for the next 24 hours.   Take frequent rest periods for the next 24 hours.   Walking will help get rid of the air and reduce the bloated feeling in your belly (abdomen).   No driving for 24 hours (because of the medicine (anesthesia) used during the test).    Do not sign any important legal documents or operate any machinery for 24 hours (because of the anesthesia used during the test).  NUTRITION  Drink plenty of fluids.   You may resume your normal diet as instructed by your doctor.   Begin with a light meal and progress to your normal diet. Heavy or fried foods are harder to digest and may make you feel sick to your stomach (nauseated).   Avoid alcoholic beverages for 24 hours or as instructed.  MEDICATIONS  You may resume your normal medications unless your doctor tells you otherwise.  WHAT YOU CAN EXPECT TODAY  Some feelings of bloating in the abdomen.   Passage of more gas than usual.   Spotting of blood in your stool or on the toilet paper.  IF YOU HAD POLYPS REMOVED DURING THE COLONOSCOPY:  No aspirin products for 7 days or as instructed.   No alcohol for 7 days or as instructed.   Eat a soft diet for the next 24 hours.  FINDING OUT THE RESULTS OF YOUR TEST Not all test results are available during your visit. If your test results are not back during the visit, make an appointment  with your caregiver to find out the results. Do not assume everything is normal if you have not heard from your caregiver or the medical facility. It is important for you to follow up on all of your test results.  SEEK IMMEDIATE MEDICAL ATTENTION IF:  You have more than a spotting of blood in your stool.   Your belly is swollen (abdominal distention).   You are nauseated or vomiting.   You have a temperature over 101.   You have abdominal pain or discomfort that is severe or gets worse throughout the day.   Your colon looked good today.  No polyps.  I recommend a repeat colonoscopy in 10 years for screening purposes  At patient request, I called Jerry Haugen at [phone] -left message on voicemail  PATIENT INSTRUCTIONS POST-ANESTHESIA  IMMEDIATELY FOLLOWING SURGERY:  Do not drive or operate machinery for the first twenty four hours after surgery.  Do not make any important decisions for twenty four hours after surgery or while taking narcotic pain medications or sedatives.  If you develop intractable nausea and vomiting or a severe headache please notify your doctor immediately.  FOLLOW-UP:  Please make an appointment with your surgeon as instructed. You do not need to follow up with anesthesia unless specifically instructed to do so.  WOUND CARE INSTRUCTIONS (if applicable):  Keep a dry clean dressing on the anesthesia/puncture wound site if there is drainage.  Once the wound has quit draining you  may leave it open to air.  Generally you should leave the bandage intact for twenty four hours unless there is drainage.  If the epidural site drains for more than 36-48 hours please call the anesthesia department.  QUESTIONS?:  Please feel free to call your physician or the hospital operator if you have any questions, and they will be happy to assist you.     Colonoscopy, Adult, Care After This sheet gives you information about how to care for yourself after your procedure. Your  doctor may also give you more specific instructions. If you have problems or questions, call your doctor. What can I expect after the procedure? After the procedure, it is common to have:  A small amount of blood in your poop (stool) for 24 hours.  Some gas.  Mild cramping or bloating in your belly (abdomen). Follow these instructions at home: Eating and drinking   Drink enough fluid to keep your pee (urine) pale yellow.  Follow instructions from your doctor about what you cannot eat or drink.  Return to your normal diet as told by your doctor. Avoid heavy or fried foods that are hard to digest. Activity  Rest as told by your doctor.  Do not sit for a long time without moving. Get up to take short walks every 1-2 hours. This is important. Ask for help if you feel weak or unsteady.  Return to your normal activities as told by your doctor. Ask your doctor what activities are safe for you. To help cramping and bloating:   Try walking around.  Put heat on your belly as told by your doctor. Use the heat source that your doctor recommends, such as a moist heat pack or a heating pad. ? Put a towel between your skin and the heat source. ? Leave the heat on for 20-30 minutes. ? Remove the heat if your skin turns bright red. This is very important if you are unable to feel pain, heat, or cold. You may have a greater risk of getting burned. General instructions  For the first 24 hours after the procedure: ? Do not drive or use machinery. ? Do not sign important documents. ? Do not drink alcohol. ? Do your daily activities more slowly than normal. ? Eat foods that are soft and easy to digest.  Take over-the-counter or prescription medicines only as told by your doctor.  Keep all follow-up visits as told by your doctor. This is important. Contact a doctor if:  You have blood in your poop 2-3 days after the procedure. Get help right away if:  You have more than a small amount of  blood in your poop.  You see large clumps of tissue (blood clots) in your poop.  Your belly is swollen.  You feel like you may vomit (nauseous).  You vomit.  You have a fever.  You have belly pain that gets worse, and medicine does not help your pain. Summary  After the procedure, it is common to have a small amount of blood in your poop. You may also have mild cramping and bloating in your belly.  For the first 24 hours after the procedure, do not drive or use machinery, do not sign important documents, and do not drink alcohol.  Get help right away if you have a lot of blood in your poop, feel like you may vomit, have a fever, or have more belly pain. This information is not intended to replace advice given to you by  your health care provider. Make sure you discuss any questions you have with your health care provider. Document Revised: 05/15/2019 Document Reviewed: 05/15/2019 Elsevier Patient Education  [place].

## 2020-07-17 NOTE — Op Note (Signed)
[facility] Patient Name: Michelle [name] Procedure Date: 07/17/2020 10:03 AM MRN: [id number] Date of Birth: December 30, 1965 Attending MD: Norvel Richards , MD CSN: [id number] Age: 54 Admit Type: Outpatient Procedure:                Colonoscopy Indications:              Screening for colorectal malignant neoplasm Providers:                Norvel Richards, MD, Otis Peak B. Sharon Seller, RN,                            Caprice Kluver, Rosina Lowenstein, RN Referring MD:              Medicines:                Midazolam 7 mg IV, Meperidine 50 mg IV Complications:            No immediate complications. Estimated Blood Loss:     Estimated blood loss: none. Procedure:                Pre-Anesthesia Assessment:                           - Prior to the procedure, a History and Physical                            was performed, and patient medications and                            allergies were reviewed. The patient's tolerance of                            previous anesthesia was also reviewed. The risks                            and benefits of the procedure and the sedation                            options and risks were discussed with the patient.                            All questions were answered, and informed consent                            was obtained. Prior Anticoagulants: The patient has                            taken no previous anticoagulant or antiplatelet                            agents. ASA Grade Assessment: II - A patient with                            mild systemic disease. After reviewing the risks  and benefits, the patient was deemed in                            satisfactory condition to undergo the procedure.                           After obtaining informed consent, the colonoscope                            was passed under direct vision. Throughout the                            procedure, the patient's blood pressure, pulse, and                             oxygen saturations were monitored continuously. The                            CF-[id number] ([id number]) scope was introduced through                            the anus and advanced to the the ileocecal valve.                            The colonoscopy was performed without difficulty.                            The patient tolerated the procedure well. The                            quality of the bowel preparation was adequate. Scope In: 10:23:22 AM Scope Out: 10:36:53 AM Scope Withdrawal Time: 0 hours 6 minutes 12 seconds  Total Procedure Duration: 0 hours 13 minutes 31 seconds  Findings:      The perianal and digital rectal examinations were normal.      The colon (entire examined portion) appeared normal. Impression:               - The entire examined colon is normal.                           - No specimens collected. Moderate Sedation:      Moderate (conscious) sedation was administered by the endoscopy nurse       and supervised by the endoscopist. The following parameters were       monitored: oxygen saturation, heart rate, blood pressure, respiratory       rate, EKG, adequacy of pulmonary ventilation, and response to care.       Total physician intraservice time was 21 minutes. Recommendation:           - Patient has a contact number available for                            emergencies. The signs and symptoms of potential  delayed complications were discussed with the                            patient. Return to normal activities tomorrow.                            Written discharge instructions were provided to the                            patient.                           - Advance diet as tolerated.                           - Repeat colonoscopy in 10 years for screening                            purposes.                           - Return to GI office PRN. Procedure Code(s):        --- Professional ---                            [phone], Colonoscopy, flexible; diagnostic, including                            collection of specimen(s) by brushing or washing,                            when performed (separate procedure)                           G0500, Moderate sedation services provided by the                            same physician or other qualified health care                            professional performing a gastrointestinal                            endoscopic service that sedation supports,                            requiring the presence of an independent trained                            observer to assist in the monitoring of the                            patient's level of consciousness and physiological                            status; initial 15 minutes of intra-service  time;                            patient age 25 years or older (additional time may                            be reported with [phone], as appropriate) Diagnosis Code(s):        --- Professional ---                           Z12.11, Encounter for screening for malignant                            neoplasm of colon CPT copyright 2019 American Medical Association. All rights reserved. The codes documented in this report are preliminary and upon coder review may  be revised to meet current compliance requirements. Cristopher Estimable. Haim Hansson, MD Norvel Richards, MD 07/17/2020 10:47:07 AM This report has been signed electronically. Number of Addenda: 0

## 2020-07-22 ENCOUNTER — Encounter (HOSPITAL_COMMUNITY): Payer: Self-pay | Admitting: Internal Medicine

## 2020-11-05 ENCOUNTER — Other Ambulatory Visit: Payer: Self-pay

## 2020-11-05 DIAGNOSIS — E038 Other specified hypothyroidism: Secondary | ICD-10-CM

## 2020-11-07 DIAGNOSIS — E038 Other specified hypothyroidism: Secondary | ICD-10-CM | POA: Diagnosis not present

## 2020-11-08 LAB — TSH: TSH: 5.71 u[IU]/mL — ABNORMAL HIGH (ref 0.450–4.500)

## 2020-11-08 LAB — T4, FREE: Free T4: 1.05 ng/dL (ref 0.82–1.77)

## 2020-11-13 ENCOUNTER — Ambulatory Visit (INDEPENDENT_AMBULATORY_CARE_PROVIDER_SITE_OTHER): Payer: BC Managed Care – PPO | Admitting: "Endocrinology

## 2020-11-13 ENCOUNTER — Other Ambulatory Visit: Payer: Self-pay

## 2020-11-13 ENCOUNTER — Encounter: Payer: Self-pay | Admitting: "Endocrinology

## 2020-11-13 VITALS — BP 132/78 | HR 80 | Ht 64.5 in | Wt 148.4 lb

## 2020-11-13 DIAGNOSIS — E039 Hypothyroidism, unspecified: Secondary | ICD-10-CM | POA: Diagnosis not present

## 2020-11-13 MED ORDER — LEVOTHYROXINE SODIUM 25 MCG PO TABS: 25.0000 ug | ORAL_TABLET | Freq: Every day | ORAL | 2 refills | Status: DC

## 2020-11-13 NOTE — Progress Notes (Signed)
11/13/2020, 11:28 AM     Endocrinology follow-up note    Subjective:    Patient ID: Michelle [name], female    DOB: 1966/10/26, PCP Sharilyn Sites, MD   Past Medical History:  Diagnosis Date  . Colon polyps    hyperplastic polyps  . GERD (gastroesophageal reflux disease)   . Hematochezia   . HTN (hypertension)   . IBS (irritable bowel syndrome)   . Thyroid disease    Past Surgical History:  Procedure Laterality Date  . CHOLECYSTECTOMY    . COLONOSCOPY  09/16/09   RMR: hyperplastic polyps  . COLONOSCOPY N/A 07/17/2020   Procedure: COLONOSCOPY;  Surgeon: Daneil Dolin, MD;  Location: [facility];  Service: Endoscopy;  Laterality: N/A;  10:15  . ESOPHAGOGASTRODUODENOSCOPY  08/13/06   hiatal hernia/mild gastritis  . FOOT SURGERY    . TONSILLECTOMY     Social History   Socioeconomic History  . Marital status: Married    Spouse name: Not on file  . Number of children: Not on file  . Years of education: Not on file  . Highest education level: Not on file  Occupational History  . Occupation: Administrative Asst At [employer]  . Smoking status: Never Smoker  . Smokeless tobacco: Never Used  Vaping Use  . Vaping Use: Never used  Substance and Sexual Activity  . Alcohol use: No  . Drug use: No  . Sexual activity: Yes    Birth control/protection: Post-menopausal  Other Topics Concern  . Not on file  Social History Narrative   Lives w/ husband   Social Determinants of Health   Financial Resource Strain: Low Risk   . Difficulty of Paying Living Expenses: Not hard at all  Food Insecurity: No Food Insecurity  . Worried About Charity fundraiser in the Last Year: Never true  . Ran Out of Food in the Last Year: Never true  Transportation Needs: No Transportation Needs  . Lack of Transportation (Medical): No  . Lack of Transportation (Non-Medical): No  Physical Activity: Insufficiently Active  . Days of  Exercise per Week: 3 days  . Minutes of Exercise per Session: 40 min  Stress: No Stress Concern Present  . Feeling of Stress : Only a little  Social Connections: Moderately Integrated  . Frequency of Communication with Friends and Family: Twice a week  . Frequency of Social Gatherings with Friends and Family: Once a week  . Attends Religious Services: More than 4 times per year  . Active Member of Clubs or Organizations: No  . Attends Archivist Meetings: Never  . Marital Status: Married   Outpatient Encounter Medications as of 11/13/2020  Medication Sig  . levothyroxine (SYNTHROID) 25 MCG tablet Take 1 tablet (25 mcg total) by mouth daily before breakfast.  . atenolol (TENORMIN) 25 MG tablet Take 25 mg by mouth daily.   . Brimonidine Tartrate (LUMIFY) 0.025 % SOLN Place 1 drop into both eyes daily as needed (red eyes).  . calcium carbonate (TUMS - DOSED IN MG ELEMENTAL CALCIUM) 500 MG chewable tablet Chew 2 tablets by mouth at bedtime. Takes tums on days that she doesn't take Zegerid.   . diazepam (VALIUM) 2 MG  tablet Take 2 mg by mouth daily as needed for sedation.   Marland Kitchen ketotifen (ALLERGY EYE DROPS) 0.025 % ophthalmic solution Place 1 drop into both eyes daily.  . Multiple Vitamins-Minerals (MULTIVITAMIN WOMEN 50+ PO) Take 1 tablet by mouth daily.   Earney Navy Bicarbonate (ZEGERID) 20-1100 MG CAPS capsule Take 1 capsule by mouth 2 (two) times a week.   Marland Kitchen oxymetazoline (AFRIN) 0.05 % nasal spray Place 1 spray into both nostrils daily as needed for congestion.   No facility-administered encounter medications on file as of 11/13/2020.   ALLERGIES: No Known Allergies  VACCINATION STATUS:  There is no immunization history on file for this patient.  HPI Michelle [name] is 55 y.o. female who is being seen in follow-up.  She was previously seen in consultation for abnormal thyroid function tests related to subacute thyroiditis.     - She is  not currently on any  antithyroid medications nor thyroid hormone supplements.   -Her history is significant for an episode of subacute thyroiditis with transient thyrotoxicosis followed by short period of  hypothyroidism which required treatment with low-dose levothyroxine which was stopped prior to her last visit.    She presents with complaints of cold ROS, weight gain, fatigue.  Her thyroid function tests are consistent with mild hypothyroidism. -She has family history of unidentified thyroid dysfunction in 1 of her sisters.  Denies any family history of thyroid cancer.  -She denies any history of being overweight or obese.  She is currently on atenolol 25 mg p.o. daily which was started originally for hypertension. -She denies dysphagia, odynophagia, no recent voice change.  Review of Systems Limited as above.  Objective:    BP 132/78   Pulse 80   Ht 5' 4.5" (1.638 m)   Wt 148 lb 6.4 oz (67.3 kg)   BMI 25.08 kg/m   Wt Readings from Last 3 Encounters:  11/13/20 148 lb 6.4 oz (67.3 kg)  07/17/20 140 lb (63.5 kg)  04/02/20 145 lb 12.8 oz (66.1 kg)    Physical Exam  CMP     Component Value Date/Time   NA 142 01/25/2018 2217   NA 140 09/21/2011 1348   K 3.5 01/25/2018 2217   K 4.4 09/21/2011 1348   CL 104 01/25/2018 2217   CL 102 09/21/2011 1348   CO2 26 01/25/2018 2217   CO2 31 09/21/2011 1348   GLUCOSE 108 (H) 01/25/2018 2217   BUN 16 01/25/2018 2217   BUN 14 09/21/2011 1348   CREATININE 0.57 01/25/2018 2217   CREATININE 0.63 09/21/2011 1348   CALCIUM 9.6 01/25/2018 2217   CALCIUM 9.4 09/21/2011 1348   PROT 5.7 (L) 12/27/2008 0450   ALBUMIN 4.4 09/21/2011 1348   AST 23 09/21/2011 1348   ALT 23 09/21/2011 1348   ALKPHOS 76 09/21/2011 1348   BILITOT 0.5 09/21/2011 1348   GFRNONAA >60 01/25/2018 2217   GFRAA >60 01/25/2018 2217      Lab Results  Component Value Date   TSH 5.710 (H) 11/07/2020   TSH 4.31 11/08/2019   TSH 3.10 10/28/2018   TSH 5.65 (H) 05/02/2018   TSH 0.01 (A)  03/04/2018   TSH 0.002 (L) 01/25/2018   FREET4 1.05 11/07/2020   FREET4 1.1 11/08/2019   FREET4 1.1 10/28/2018   FREET4 1.1 05/02/2018      Assessment & Plan:   1.  hypothyroidism- -her previsit thyroid function tests are consistent with mild hypothyroidism.  She would benefit from early initiation of thyroid hormone  supplement.  I discussed and prescribed levothyroxine 25 mcg p.o. daily before breakfast.  - We discussed about the correct intake of her thyroid hormone, on empty stomach at fasting, with water, separated by at least 30 minutes from breakfast and other medications,  and separated by more than 4 hours from calcium, iron, multivitamins, acid reflux medications (PPIs). -Patient is made aware of the fact that thyroid hormone replacement is needed for life, dose to be adjusted by periodic monitoring of thyroid function tests.  She is already on atenolol 25 mg p.o. daily which was originally started for treatment of hypertension.  She does not have clinical goiter, no need for thyroid imaging at this time.     - Time spent on this patient care encounter:  20 minutes of which 50% was spent in  counseling and the rest reviewing  her current and  previous labs / studies and medications  doses and developing a plan for long term care. Clay R Mallette  participated in the discussions, expressed understanding, and voiced agreement with the above plans.  All questions were answered to her satisfaction. she is encouraged to contact clinic should she have any questions or concerns prior to her return visit.   Follow up plan: Return in about 8 weeks (around 01/08/2021) for F/U with Pre-visit Labs.   Glade Lloyd, MD [facility] [address] [place], [place] [address] Phone: [phone]  Fax: [phone]     11/13/2020, 11:28 AM  This note was partially dictated with voice recognition software. Similar sounding words can be  transcribed inadequately or may not  be corrected upon review.

## 2020-12-31 DIAGNOSIS — E039 Hypothyroidism, unspecified: Secondary | ICD-10-CM | POA: Diagnosis not present

## 2021-01-01 LAB — T4, FREE: Free T4: 1.25 ng/dL (ref 0.82–1.77)

## 2021-01-01 LAB — TSH: TSH: 2.68 u[IU]/mL (ref 0.450–4.500)

## 2021-01-08 ENCOUNTER — Other Ambulatory Visit: Payer: Self-pay

## 2021-01-08 ENCOUNTER — Encounter: Payer: Self-pay | Admitting: "Endocrinology

## 2021-01-08 ENCOUNTER — Ambulatory Visit: Payer: BC Managed Care – PPO | Admitting: "Endocrinology

## 2021-01-08 VITALS — BP 132/76 | HR 68 | Ht 64.5 in | Wt 151.0 lb

## 2021-01-08 DIAGNOSIS — E039 Hypothyroidism, unspecified: Secondary | ICD-10-CM | POA: Diagnosis not present

## 2021-01-08 NOTE — Progress Notes (Signed)
01/08/2021, 9:13 AM     Endocrinology follow-up note   Subjective:    Patient ID: Michelle [name], female    DOB: 30-Aug-1966, PCP Sharilyn Sites, MD   Past Medical History:  Diagnosis Date  . Colon polyps    hyperplastic polyps  . GERD (gastroesophageal reflux disease)   . Hematochezia   . HTN (hypertension)   . IBS (irritable bowel syndrome)   . Thyroid disease    Past Surgical History:  Procedure Laterality Date  . CHOLECYSTECTOMY    . COLONOSCOPY  09/16/09   RMR: hyperplastic polyps  . COLONOSCOPY N/A 07/17/2020   Procedure: COLONOSCOPY;  Surgeon: Daneil Dolin, MD;  [facility];  Service: Endoscopy;  Laterality: N/A;  10:15  . ESOPHAGOGASTRODUODENOSCOPY  08/13/06   hiatal hernia/mild gastritis  . FOOT SURGERY    . TONSILLECTOMY     Social History   Socioeconomic History  . Marital status: Married    Spouse name: Not on file  . Number of children: Not on file  . Years of education: Not on file  . Highest education level: Not on file  Occupational History  . Occupation: Administrative Asst At [employer]  . Smoking status: Never Smoker  . Smokeless tobacco: Never Used  Vaping Use  . Vaping Use: Never used  Substance and Sexual Activity  . Alcohol use: No  . Drug use: No  . Sexual activity: Yes    Birth control/protection: Post-menopausal  Other Topics Concern  . Not on file  Social History Narrative   Lives w/ husband   Social Determinants of Health   Financial Resource Strain: Low Risk   . Difficulty of Paying Living Expenses: Not hard at all  Food Insecurity: No Food Insecurity  . Worried About Charity fundraiser in the Last Year: Never true  . Ran Out of Food in the Last Year: Never true  Transportation Needs: No Transportation Needs  . Lack of Transportation (Medical): No  . Lack of Transportation (Non-Medical): No  Physical Activity: Insufficiently Active  . Days of  Exercise per Week: 3 days  . Minutes of Exercise per Session: 40 min  Stress: No Stress Concern Present  . Feeling of Stress : Only a little  Social Connections: Moderately Integrated  . Frequency of Communication with Friends and Family: Twice a week  . Frequency of Social Gatherings with Friends and Family: Once a week  . Attends Religious Services: More than 4 times per year  . Active Member of Clubs or Organizations: No  . Attends Archivist Meetings: Never  . Marital Status: Married   Outpatient Encounter Medications as of 01/08/2021  Medication Sig  . atenolol (TENORMIN) 25 MG tablet Take 25 mg by mouth daily.   . Brimonidine Tartrate (LUMIFY) 0.025 % SOLN Place 1 drop into both eyes daily as needed (red eyes).  . calcium carbonate (TUMS - DOSED IN MG ELEMENTAL CALCIUM) 500 MG chewable tablet Chew 2 tablets by mouth at bedtime. Takes tums on days that she doesn't take Zegerid.   . diazepam (VALIUM) 2 MG tablet Take 2 mg by mouth daily as needed for sedation.   Marland Kitchen ketotifen (ALLERGY EYE DROPS) 0.025 %  ophthalmic solution Place 1 drop into both eyes daily.  Marland Kitchen levothyroxine (SYNTHROID) 25 MCG tablet Take 1 tablet (25 mcg total) by mouth daily before breakfast.  . Multiple Vitamins-Minerals (MULTIVITAMIN WOMEN 50+ PO) Take 1 tablet by mouth daily.   Earney Navy Bicarbonate (ZEGERID) 20-1100 MG CAPS capsule Take 1 capsule by mouth 2 (two) times a week.   Marland Kitchen oxymetazoline (AFRIN) 0.05 % nasal spray Place 1 spray into both nostrils daily as needed for congestion.   No facility-administered encounter medications on file as of 01/08/2021.   ALLERGIES: No Known Allergies  VACCINATION STATUS:  There is no immunization history on file for this patient.  HPI Michelle [name] is 55 y.o. female who is being seen in follow-up.  She is currently on levothyroxine 25 mcg for partial hypothyroidism.  She was previously treated for intermittent thyroiditis with transient  thyrotoxicosis. -She has no new complaints today.  She has steady weight.  She denies cold intolerance, palpitations, tremors.  She is still on atenolol 25 mg p.o. daily which was originally started for hypertension. -She denies dysphagia, odynophagia, no recent voice change.  Review of Systems Limited as above.  Objective:    BP 132/76   Pulse 68   Ht 5' 4.5" (1.638 m)   Wt 151 lb (68.5 kg)   BMI 25.52 kg/m   Wt Readings from Last 3 Encounters:  01/08/21 151 lb (68.5 kg)  11/13/20 148 lb 6.4 oz (67.3 kg)  07/17/20 140 lb (63.5 kg)    Physical Exam  CMP     Component Value Date/Time   NA 142 01/25/2018 2217   NA 140 09/21/2011 1348   K 3.5 01/25/2018 2217   K 4.4 09/21/2011 1348   CL 104 01/25/2018 2217   CL 102 09/21/2011 1348   CO2 26 01/25/2018 2217   CO2 31 09/21/2011 1348   GLUCOSE 108 (H) 01/25/2018 2217   BUN 16 01/25/2018 2217   BUN 14 09/21/2011 1348   CREATININE 0.57 01/25/2018 2217   CREATININE 0.63 09/21/2011 1348   CALCIUM 9.6 01/25/2018 2217   CALCIUM 9.4 09/21/2011 1348   PROT 5.7 (L) 12/27/2008 0450   ALBUMIN 4.4 09/21/2011 1348   AST 23 09/21/2011 1348   ALT 23 09/21/2011 1348   ALKPHOS 76 09/21/2011 1348   BILITOT 0.5 09/21/2011 1348   GFRNONAA >60 01/25/2018 2217   GFRAA >60 01/25/2018 2217      Lab Results  Component Value Date   TSH 2.680 12/31/2020   TSH 5.710 (H) 11/07/2020   TSH 4.31 11/08/2019   TSH 3.10 10/28/2018   TSH 5.65 (H) 05/02/2018   TSH 0.01 (A) 03/04/2018   TSH 0.002 (L) 01/25/2018   FREET4 1.25 12/31/2020   FREET4 1.05 11/07/2020   FREET4 1.1 11/08/2019   FREET4 1.1 10/28/2018   FREET4 1.1 05/02/2018      Assessment & Plan:   1.  hypothyroidism- -her previsit thyroid function tests are consistent with appropriate replacement.  She is advised to continue her current dose of levothyroxine 25 mcg p.o. daily before breakfast.    - We discussed about the correct intake of her thyroid hormone, on empty stomach at  fasting, with water, separated by at least 30 minutes from breakfast and other medications,  and separated by more than 4 hours from calcium, iron, multivitamins, acid reflux medications (PPIs). -Patient is made aware of the fact that thyroid hormone replacement is needed for life, dose to be adjusted by periodic monitoring of thyroid function  tests.   She is already on atenolol 25 mg p.o. daily which was originally started for treatment of hypertension.  She does not have clinical goiter, no need for thyroid imaging at this time.  She is advised to maintain close follow-up with her PMD.     - Time spent on this patient care encounter:  30 minutes of which 50% was spent in  counseling and the rest reviewing  her current and  previous labs / studies and medications  doses and developing a plan for long term care, and documenting this care. Britley R Hale  participated in the discussions, expressed understanding, and voiced agreement with the above plans.  All questions were answered to her satisfaction. she is encouraged to contact clinic should she have any questions or concerns prior to her return visit.    Follow up plan: Return in about 5 months (around 06/10/2021) for F/U with Pre-visit Labs.   Glade Lloyd, MD [facility] [address] [place], [place] [address] Phone: [phone]  Fax: [phone]     01/08/2021, 9:13 AM  This note was partially dictated with voice recognition software. Similar sounding words can be transcribed inadequately or may not  be corrected upon review.

## 2021-01-20 ENCOUNTER — Telehealth: Payer: Self-pay

## 2021-01-20 NOTE — Telephone Encounter (Signed)
Since it is the lowest dose , she can stop it. She has to keep her next appointment with labs.

## 2021-01-20 NOTE — Telephone Encounter (Signed)
Left a message requesting a return call to the office. 

## 2021-01-20 NOTE — Telephone Encounter (Signed)
Patient said that in January she started back on her thyroid medication - Saturday she started feeling sick on her stomach, nausea, indigestion and acid reflux. She has stopped taking it. She said that this happened before in 2019 around this same time and you had mentioned maybe she should only take it in the winter months. Please advise.

## 2021-01-20 NOTE — Telephone Encounter (Signed)
Noted  

## 2021-01-20 NOTE — Telephone Encounter (Signed)
Pt.notified

## 2021-02-06 ENCOUNTER — Other Ambulatory Visit: Payer: Self-pay | Admitting: "Endocrinology

## 2021-02-06 DIAGNOSIS — I10 Essential (primary) hypertension: Secondary | ICD-10-CM | POA: Diagnosis not present

## 2021-02-06 DIAGNOSIS — Z1331 Encounter for screening for depression: Secondary | ICD-10-CM | POA: Diagnosis not present

## 2021-02-06 DIAGNOSIS — Z6825 Body mass index (BMI) 25.0-25.9, adult: Secondary | ICD-10-CM | POA: Diagnosis not present

## 2021-02-06 DIAGNOSIS — Z1389 Encounter for screening for other disorder: Secondary | ICD-10-CM | POA: Diagnosis not present

## 2021-02-06 DIAGNOSIS — E7849 Other hyperlipidemia: Secondary | ICD-10-CM | POA: Diagnosis not present

## 2021-02-06 DIAGNOSIS — E039 Hypothyroidism, unspecified: Secondary | ICD-10-CM | POA: Diagnosis not present

## 2021-03-13 DIAGNOSIS — R945 Abnormal results of liver function studies: Secondary | ICD-10-CM | POA: Diagnosis not present

## 2021-04-30 ENCOUNTER — Other Ambulatory Visit: Payer: Self-pay

## 2021-04-30 ENCOUNTER — Ambulatory Visit (INDEPENDENT_AMBULATORY_CARE_PROVIDER_SITE_OTHER): Payer: BC Managed Care – PPO | Admitting: Adult Health

## 2021-04-30 ENCOUNTER — Encounter: Payer: Self-pay | Admitting: Adult Health

## 2021-04-30 VITALS — BP 156/83 | HR 76 | Ht 64.0 in | Wt 136.0 lb

## 2021-04-30 DIAGNOSIS — Z1211 Encounter for screening for malignant neoplasm of colon: Secondary | ICD-10-CM | POA: Diagnosis not present

## 2021-04-30 DIAGNOSIS — Z780 Asymptomatic menopausal state: Secondary | ICD-10-CM | POA: Insufficient documentation

## 2021-04-30 DIAGNOSIS — Z01419 Encounter for gynecological examination (general) (routine) without abnormal findings: Secondary | ICD-10-CM | POA: Diagnosis not present

## 2021-04-30 LAB — HEMOCCULT GUIAC POC 1CARD (OFFICE): Fecal Occult Blood, POC: NEGATIVE

## 2021-04-30 NOTE — Progress Notes (Signed)
Patient ID: Michelle [name], female   DOB: 1966-10-03, 55 y.o.   MRN: [id number] History of Present Illness: Michelle [name] is a 55 year old white female,married, PM, in for well woman gyn exam. Lab Results  Component Value Date   DIAGPAP  04/02/2020    - Negative for intraepithelial lesion or malignancy (NILM)   [place] Negative 04/02/2020   PCP is Dr Hilma Favors.   Current Medications, Allergies, Past Medical History, Past Surgical History, Family History and Social History were reviewed in [employer] [employer] record.     Review of Systems: Patient denies any headaches, hearing loss, fatigue, blurred vision, shortness of breath, chest pain, abdominal pain, problems with bowel movements, urination, or intercourse. No joint pain or mood swings.  Denies any vaginal bleeding    Physical Exam:BP (!) 156/83 (BP Location: Right Arm, Patient Position: Sitting, Cuff Size: Normal)   Pulse 76   Ht 5\' 4"  (1.626 m)   Wt 136 lb (61.7 kg)   BMI 23.34 kg/m   General:  Well developed, well nourished, no acute distress Skin:  Warm and dry Neck:  Midline trachea, normal thyroid, good ROM, no lymphadenopathy Lungs; Clear to auscultation bilaterally Breast:  No dominant palpable mass, retraction, or nipple discharge Cardiovascular: Regular rate and rhythm Abdomen:  Soft, non tender, no hepatosplenomegaly Pelvic:  External genitalia is normal in appearance, no lesions.  The vagina is pale with loss of moisture and rugae. Urethra has no lesions or masses. The cervix is smooth. Uterus is felt to be normal size, shape, and contour.  No adnexal masses or tenderness noted.Bladder is non tender, no masses felt. Rectal: Good sphincter tone, no polyps, or hemorrhoids felt.  Hemoccult negative. Extremities/musculoskeletal:  No swelling or varicosities noted, no clubbing or cyanosis Psych:  No mood changes, alert and cooperative,seems happy AA is 0  Fall risk is low Depression screen [facility] 2/9 04/30/2021 04/02/2020  05/02/2018  Decreased Interest 0 0 0  Down, Depressed, Hopeless 0 0 0  PHQ - 2 Score 0 0 0  Altered sleeping 0 0 -  Tired, decreased energy 0 1 -  Change in appetite 0 0 -  Feeling bad or failure about yourself  0 0 -  Trouble concentrating 0 0 -  Moving slowly or fidgety/restless 0 0 -  Suicidal thoughts 0 0 -  PHQ-9 Score 0 1 -    GAD 7 : Generalized Anxiety Score 04/30/2021 04/02/2020  Nervous, Anxious, on Edge 0 0  Control/stop worrying 0 0  Worry too much - different things 0 0  Trouble relaxing 0 0  Restless 0 0  Easily annoyed or irritable 0 0  Afraid - awful might happen 0 0  Total GAD 7 Score 0 0      Upstream - 04/30/21 0626       Pregnancy Intention Screening   Does the patient want to become pregnant in the next year? N/A    Does the patient's partner want to become pregnant in the next year? N/A    Would the patient like to discuss contraceptive options today? N/A      Contraception Wrap Up   Current Method --   PM   End Method --   PM   Contraception Counseling Provided No            Examination chaperoned by Celene Squibb LPN  Impression and Plan: 1. Encounter for well woman exam with routine gynecological exam Physical in 1 year Pap in 2024 Labs with  PCP Mammogram yearly Colonoscopy per GI  2. Encounter for screening fecal occult blood testing   3. Postmenopause

## 2021-06-05 DIAGNOSIS — E039 Hypothyroidism, unspecified: Secondary | ICD-10-CM | POA: Diagnosis not present

## 2021-06-06 LAB — TSH: TSH: 2.77 u[IU]/mL (ref 0.450–4.500)

## 2021-06-06 LAB — T4, FREE: Free T4: 1.35 ng/dL (ref 0.82–1.77)

## 2021-06-10 ENCOUNTER — Other Ambulatory Visit: Payer: Self-pay

## 2021-06-10 ENCOUNTER — Ambulatory Visit: Payer: BC Managed Care – PPO | Admitting: "Endocrinology

## 2021-06-10 ENCOUNTER — Encounter: Payer: Self-pay | Admitting: "Endocrinology

## 2021-06-10 VITALS — BP 110/68 | HR 76 | Ht 64.0 in | Wt 137.8 lb

## 2021-06-10 DIAGNOSIS — E038 Other specified hypothyroidism: Secondary | ICD-10-CM

## 2021-06-10 NOTE — Progress Notes (Signed)
06/10/2021, 9:23 AM     Endocrinology follow-up note   Subjective:    Patient ID: Michelle [name], female    DOB: 10/31/1966, PCP Sharilyn Sites, MD   Past Medical History:  Diagnosis Date   Colon polyps    hyperplastic polyps   GERD (gastroesophageal reflux disease)    Hematochezia    HTN (hypertension)    IBS (irritable bowel syndrome)    Thyroid disease    Past Surgical History:  Procedure Laterality Date   CHOLECYSTECTOMY     COLONOSCOPY  09/16/09   RMR: hyperplastic polyps   COLONOSCOPY N/A 07/17/2020   Procedure: COLONOSCOPY;  Surgeon: Daneil Dolin, MD;  [facility];  Service: Endoscopy;  Laterality: N/A;  10:15   ESOPHAGOGASTRODUODENOSCOPY  08/13/06   hiatal hernia/mild gastritis   FOOT SURGERY     TONSILLECTOMY     Social History   Socioeconomic History   Marital status: Married    Spouse name: Not on file   Number of children: Not on file   Years of education: Not on file   Highest education level: Not on file  Occupational History   Occupation: Administrative Asst At [employer]  Tobacco Use   Smoking status: Never   Smokeless tobacco: Never  Vaping Use   Vaping Use: Never used  Substance and Sexual Activity   Alcohol use: No   Drug use: No   Sexual activity: Yes    Birth control/protection: Post-menopausal  Other Topics Concern   Not on file  Social History Narrative   Lives w/ husband   Social Determinants of Health   Financial Resource Strain: Low Risk    Difficulty of Paying Living Expenses: Not hard at all  Food Insecurity: No Food Insecurity   Worried About Charity fundraiser in the Last Year: Never true   [place] in the Last Year: Never true  Transportation Needs: No Transportation Needs   Lack of Transportation (Medical): No   Lack of Transportation (Non-Medical): No  Physical Activity: Sufficiently Active   Days of Exercise per Week: 4 days   Minutes of Exercise  per Session: 40 min  Stress: No Stress Concern Present   Feeling of Stress : Not at all  Social Connections: Moderately Integrated   Frequency of Communication with Friends and Family: More than three times a week   Frequency of Social Gatherings with Friends and Family: Once a week   Attends Religious Services: More than 4 times per year   Active Member of [employer] or Organizations: No   Attends Archivist Meetings: Never   Marital Status: Married   Outpatient Encounter Medications as of 06/10/2021  Medication Sig   Brimonidine Tartrate (LUMIFY) 0.025 % SOLN Place 1 drop into both eyes daily as needed (red eyes).   diazepam (VALIUM) 2 MG tablet Take 2 mg by mouth daily as needed for sedation.   atenolol (TENORMIN) 25 MG tablet Take 25 mg by mouth daily.    calcium carbonate (TUMS - DOSED IN MG ELEMENTAL CALCIUM) 500 MG chewable tablet Chew 2 tablets by mouth at bedtime. Takes tums on days that she doesn't take Zegerid.   ketotifen (ZADITOR) 0.025 % ophthalmic solution  Place 1 drop into both eyes daily.   levothyroxine (SYNTHROID) 25 MCG tablet Take 1 tablet (25 mcg total) by mouth daily before breakfast. (Patient not taking: Reported on 04/30/2021)   Multiple Vitamins-Minerals (MULTIVITAMIN WOMEN 50+ PO) Take 1 tablet by mouth daily.    Omeprazole-Sodium Bicarbonate (ZEGERID) 20-1100 MG CAPS capsule Take 1 capsule by mouth 2 (two) times a week.   oxymetazoline (AFRIN) 0.05 % nasal spray Place 1 spray into both nostrils daily as needed for congestion. (Patient not taking: Reported on 04/30/2021)   polyethylene glycol (MIRALAX / GLYCOLAX) 17 g packet Take 17 g by mouth daily.   No facility-administered encounter medications on file as of 06/10/2021.   ALLERGIES: No Known Allergies  VACCINATION STATUS:  There is no immunization history on file for this patient.  HPI Michelle [name] is 55 y.o. female who is being seen in follow-up.  She was briefly treated with low-dose levothyroxine  25 mcg p.o. daily for mild hypothyroidism.  Since March 2022, she has not taken her levothyroxine.  Her previsit thyroid function tests are within normal limits.  She has no new complaints.   She has lost 10 pounds intentionally.   She denies cold intolerance, palpitations, tremors.  She is still on atenolol 25 mg p.o. daily which was originally started for hypertension. -She denies dysphagia, odynophagia, no recent voice change.  Review of Systems Limited as above.  Objective:    BP 110/68   Pulse 76   Ht '5\' 4"'$  (1.626 m)   Wt 137 lb 12.8 oz (62.5 kg)   BMI 23.65 kg/m   Wt Readings from Last 3 Encounters:  06/10/21 137 lb 12.8 oz (62.5 kg)  04/30/21 136 lb (61.7 kg)  01/08/21 151 lb (68.5 kg)    Physical Exam  CMP     Component Value Date/Time   NA 142 01/25/2018 2217   NA 140 09/21/2011 1348   K 3.5 01/25/2018 2217   K 4.4 09/21/2011 1348   CL 104 01/25/2018 2217   CL 102 09/21/2011 1348   CO2 26 01/25/2018 2217   CO2 31 09/21/2011 1348   GLUCOSE 108 (H) 01/25/2018 2217   BUN 16 01/25/2018 2217   BUN 14 09/21/2011 1348   CREATININE 0.57 01/25/2018 2217   CREATININE 0.63 09/21/2011 1348   CALCIUM 9.6 01/25/2018 2217   CALCIUM 9.4 09/21/2011 1348   PROT 5.7 (L) 12/27/2008 0450   ALBUMIN 4.4 09/21/2011 1348   AST 23 09/21/2011 1348   ALT 23 09/21/2011 1348   ALKPHOS 76 09/21/2011 1348   BILITOT 0.5 09/21/2011 1348   GFRNONAA >60 01/25/2018 2217   GFRAA >60 01/25/2018 2217      Lab Results  Component Value Date   TSH 2.770 06/05/2021   TSH 2.680 12/31/2020   TSH 5.710 (H) 11/07/2020   TSH 4.31 11/08/2019   TSH 3.10 10/28/2018   TSH 5.65 (H) 05/02/2018   TSH 0.01 (A) 03/04/2018   TSH 0.002 (L) 01/25/2018   FREET4 1.35 06/05/2021   FREET4 1.25 12/31/2020   FREET4 1.05 11/07/2020   FREET4 1.1 11/08/2019   FREET4 1.1 10/28/2018   FREET4 1.1 05/02/2018      Assessment & Plan:   1.  hypothyroidism-resolved -her previsit thyroid function tests are within  normal limits.  She did not tolerate the lowest dose of levothyroxine.  She is advised to stay off of it until next measurement in a year.     She is already on atenolol 25 mg p.o.  daily which was originally started for treatment of hypertension.  She does not have clinical goiter, no need for thyroid imaging at this time.  She is advised to maintain close follow-up with her PMD.  I spent 22 minutes in the care of the patient today including review of labs from Thyroid Function, CMP, and other relevant labs ; imaging/biopsy records (current and previous including abstractions from other facilities); face-to-face time discussing  her lab results and symptoms, medications doses, her options of short and long term treatment based on the latest standards of care / guidelines;   and documenting the encounter.  Michelle [name]  participated in the discussions, expressed understanding, and voiced agreement with the above plans.  All questions were answered to her satisfaction. she is encouraged to contact clinic should she have any questions or concerns prior to her return visit.    Follow up plan: Return in about 1 year (around 06/10/2022) for F/U with Pre-visit Labs.   Glade Lloyd, MD [facility] [address] [place], [place] [address] Phone: [phone]  Fax: [phone]     06/10/2021, 9:23 AM  This note was partially dictated with voice recognition software. Similar sounding words can be transcribed inadequately or may not  be corrected upon review.

## 2021-09-02 DIAGNOSIS — D225 Melanocytic nevi of trunk: Secondary | ICD-10-CM | POA: Diagnosis not present

## 2021-09-02 DIAGNOSIS — L570 Actinic keratosis: Secondary | ICD-10-CM | POA: Diagnosis not present

## 2021-09-02 DIAGNOSIS — X32XXXD Exposure to sunlight, subsequent encounter: Secondary | ICD-10-CM | POA: Diagnosis not present

## 2021-09-02 DIAGNOSIS — Z1283 Encounter for screening for malignant neoplasm of skin: Secondary | ICD-10-CM | POA: Diagnosis not present

## 2021-10-22 DIAGNOSIS — Z6823 Body mass index (BMI) 23.0-23.9, adult: Secondary | ICD-10-CM | POA: Diagnosis not present

## 2021-10-22 DIAGNOSIS — J329 Chronic sinusitis, unspecified: Secondary | ICD-10-CM | POA: Diagnosis not present

## 2021-10-22 DIAGNOSIS — E039 Hypothyroidism, unspecified: Secondary | ICD-10-CM | POA: Diagnosis not present

## 2021-10-22 DIAGNOSIS — I10 Essential (primary) hypertension: Secondary | ICD-10-CM | POA: Diagnosis not present

## 2021-11-24 DIAGNOSIS — Z1231 Encounter for screening mammogram for malignant neoplasm of breast: Secondary | ICD-10-CM | POA: Diagnosis not present

## 2021-12-03 HISTORY — PX: MOUTH SURGERY: SHX715

## 2022-01-19 DIAGNOSIS — I10 Essential (primary) hypertension: Secondary | ICD-10-CM | POA: Diagnosis not present

## 2022-01-19 DIAGNOSIS — F419 Anxiety disorder, unspecified: Secondary | ICD-10-CM | POA: Diagnosis not present

## 2022-01-19 DIAGNOSIS — E782 Mixed hyperlipidemia: Secondary | ICD-10-CM | POA: Diagnosis not present

## 2022-01-19 DIAGNOSIS — K589 Irritable bowel syndrome without diarrhea: Secondary | ICD-10-CM | POA: Diagnosis not present

## 2022-01-19 DIAGNOSIS — Z6823 Body mass index (BMI) 23.0-23.9, adult: Secondary | ICD-10-CM | POA: Diagnosis not present

## 2022-05-26 ENCOUNTER — Telehealth: Payer: Self-pay | Admitting: "Endocrinology

## 2022-05-26 DIAGNOSIS — E038 Other specified hypothyroidism: Secondary | ICD-10-CM

## 2022-05-26 NOTE — Telephone Encounter (Signed)
Can you update labs for pt

## 2022-05-26 NOTE — Telephone Encounter (Signed)
Labs updated and sent to [facility]. ?

## 2022-06-02 DIAGNOSIS — E038 Other specified hypothyroidism: Secondary | ICD-10-CM | POA: Diagnosis not present

## 2022-06-03 LAB — TSH: TSH: 4.13 u[IU]/mL (ref 0.450–4.500)

## 2022-06-03 LAB — T3, FREE: T3, Free: 3.2 pg/mL (ref 2.0–4.4)

## 2022-06-03 LAB — T4, FREE: Free T4: 1.3 ng/dL (ref 0.82–1.77)

## 2022-06-10 ENCOUNTER — Ambulatory Visit: Payer: BC Managed Care – PPO | Admitting: "Endocrinology

## 2022-06-10 ENCOUNTER — Encounter: Payer: Self-pay | Admitting: "Endocrinology

## 2022-06-10 VITALS — BP 120/66 | HR 60 | Ht 62.0 in | Wt 132.2 lb

## 2022-06-10 DIAGNOSIS — E038 Other specified hypothyroidism: Secondary | ICD-10-CM

## 2022-06-10 NOTE — Progress Notes (Signed)
06/10/2022, 4:31 PM     Endocrinology follow-up note   Subjective:    Patient ID: Michelle [name], female    DOB: 09/05/1966, PCP Sharilyn Sites, MD   Past Medical History:  Diagnosis Date   Colon polyps    hyperplastic polyps   GERD (gastroesophageal reflux disease)    Hematochezia    HTN (hypertension)    IBS (irritable bowel syndrome)    Thyroid disease    Past Surgical History:  Procedure Laterality Date   CHOLECYSTECTOMY     COLONOSCOPY  09/16/2009   RMR: hyperplastic polyps   COLONOSCOPY N/A 07/17/2020   Procedure: COLONOSCOPY;  Surgeon: Daneil Dolin, MD;  [facility];  Service: Endoscopy;  Laterality: N/A;  10:15   DENTAL SURGERY     ESOPHAGOGASTRODUODENOSCOPY  08/13/2006   hiatal hernia/mild gastritis   FOOT SURGERY     TONSILLECTOMY     Social History   Socioeconomic History   Marital status: Married    Spouse name: Not on file   Number of children: Not on file   Years of education: Not on file   Highest education level: Not on file  Occupational History   Occupation: Administrative Asst At [employer]  Tobacco Use   Smoking status: Never   Smokeless tobacco: Never  Vaping Use   Vaping Use: Never used  Substance and Sexual Activity   Alcohol use: No   Drug use: No   Sexual activity: Yes    Birth control/protection: Post-menopausal  Other Topics Concern   Not on file  Social History Narrative   Lives w/ husband   Social Determinants of Health   Financial Resource Strain: Low Risk  (04/30/2021)   Overall Financial Resource Strain (CARDIA)    Difficulty of Paying Living Expenses: Not hard at all  Food Insecurity: No Food Insecurity (04/30/2021)   Hunger Vital Sign    Worried About Running Out of Food in the Last Year: Never true    [place] in the Last Year: Never true  Transportation Needs: No Transportation Needs (04/30/2021)   PRAPARE - Hydrologist  (Medical): No    Lack of Transportation (Non-Medical): No  Physical Activity: Sufficiently Active (04/30/2021)   Exercise Vital Sign    Days of Exercise per Week: 4 days    Minutes of Exercise per Session: 40 min  Stress: No Stress Concern Present (04/30/2021)   [place]    Feeling of Stress : Not at all  Social Connections: Moderately Integrated (04/30/2021)   Social Connection and Isolation Panel [NHANES]    Frequency of Communication with Friends and Family: More than three times a week    Frequency of Social Gatherings with Friends and Family: Once a week    Attends Religious Services: More than 4 times per year    Active Member of [employer] or Organizations: No    Attends Archivist Meetings: Never    Marital Status: Married   Outpatient Encounter Medications as of 06/10/2022  Medication Sig   atenolol (TENORMIN) 25 MG tablet Take 25 mg by mouth daily.    Brimonidine Tartrate (LUMIFY) 0.025 % SOLN  Place 1 drop into both eyes daily as needed (red eyes).   calcium carbonate (TUMS - DOSED IN MG ELEMENTAL CALCIUM) 500 MG chewable tablet Chew 2 tablets by mouth at bedtime. Takes tums on days that she doesn't take Zegerid.   diazepam (VALIUM) 2 MG tablet Take 2 mg by mouth daily as needed for sedation.   ketotifen (ZADITOR) 0.025 % ophthalmic solution Place 1 drop into both eyes daily.   Multiple Vitamins-Minerals (MULTIVITAMIN WOMEN 50+ PO) Take 1 tablet by mouth daily.  (Patient not taking: Reported on 06/10/2022)   Omeprazole-Sodium Bicarbonate (ZEGERID) 20-1100 MG CAPS capsule Take 1 capsule by mouth 2 (two) times a week.   polyethylene glycol (MIRALAX / GLYCOLAX) 17 g packet Take 17 g by mouth daily.   [DISCONTINUED] levothyroxine (SYNTHROID) 25 MCG tablet Take 1 tablet (25 mcg total) by mouth daily before breakfast. (Patient not taking: Reported on 04/30/2021)   [DISCONTINUED] oxymetazoline (AFRIN) 0.05 % nasal spray  Place 1 spray into both nostrils daily as needed for congestion. (Patient not taking: Reported on 04/30/2021)   No facility-administered encounter medications on file as of 06/10/2022.   ALLERGIES: No Known Allergies  VACCINATION STATUS:  There is no immunization history on file for this patient.  HPI Michelle [name] is 56 y.o. female who is being seen in follow-up.  Previously, she was treated with low-dose levothyroxine 25 mcg for mild hypothyroidism.   Since March 2022, she has not taken her levothyroxine.  Her previsit thyroid function tests are within normal limits.   She has no new complaints today.    She continues to lose weight, lost 5 more pounds the last visit intentionally.   She denies cold intolerance, palpitations, tremors.  She is still on atenolol 25 mg p.o. daily which was originally started for hypertension. -She denies dysphagia, odynophagia, no recent voice change.  Review of Systems Limited as above.  Objective:    BP 120/66   Pulse 60   Ht '5\' 2"'$  (1.575 m)   Wt 132 lb 3.2 oz (60 kg)   BMI 24.18 kg/m   Wt Readings from Last 3 Encounters:  06/10/22 132 lb 3.2 oz (60 kg)  06/10/21 137 lb 12.8 oz (62.5 kg)  04/30/21 136 lb (61.7 kg)    Physical Exam  CMP     Component Value Date/Time   NA 142 01/25/2018 2217   NA 140 09/21/2011 1348   K 3.5 01/25/2018 2217   K 4.4 09/21/2011 1348   CL 104 01/25/2018 2217   CL 102 09/21/2011 1348   CO2 26 01/25/2018 2217   CO2 31 09/21/2011 1348   GLUCOSE 108 (H) 01/25/2018 2217   BUN 16 01/25/2018 2217   BUN 14 09/21/2011 1348   CREATININE 0.57 01/25/2018 2217   CREATININE 0.63 09/21/2011 1348   CALCIUM 9.6 01/25/2018 2217   CALCIUM 9.4 09/21/2011 1348   PROT 5.7 (L) 12/27/2008 0450   ALBUMIN 4.4 09/21/2011 1348   AST 23 09/21/2011 1348   ALT 23 09/21/2011 1348   ALKPHOS 76 09/21/2011 1348   BILITOT 0.5 09/21/2011 1348   GFRNONAA >60 01/25/2018 2217   GFRAA >60 01/25/2018 2217      Lab Results  Component  Value Date   TSH 4.130 06/02/2022   TSH 2.770 06/05/2021   TSH 2.680 12/31/2020   TSH 5.710 (H) 11/07/2020   TSH 4.31 11/08/2019   TSH 3.10 10/28/2018   TSH 5.65 (H) 05/02/2018   TSH 0.01 (A) 03/04/2018   TSH 0.002 (  L) 01/25/2018   FREET4 1.30 06/02/2022   FREET4 1.35 06/05/2021   FREET4 1.25 12/31/2020   FREET4 1.05 11/07/2020   FREET4 1.1 11/08/2019   FREET4 1.1 10/28/2018   FREET4 1.1 05/02/2018      Assessment & Plan:   1.  hypothyroidism-resolved Her previsit thyroid function tests are consistent with euthyroid for the patient.  She would not need intervention with thyroid hormone at this time.  She is already on atenolol 25 mg p.o. daily which was originally started for treatment of hypertension.  She does not have clinical goiter, no need for thyroid imaging at this time. She will have repeat thyroid function test before her next visit in a year.  She is advised to maintain close follow-up with her PMD.  I spent 20 minutes in the care of the patient today including review of labs from Thyroid Function, CMP, and other relevant labs ; imaging/biopsy records (current and previous including abstractions from other facilities); face-to-face time discussing  her lab results and symptoms, medications doses, her options of short and long term treatment based on the latest standards of care / guidelines;   and documenting the encounter.  Pernella R Reels  participated in the discussions, expressed understanding, and voiced agreement with the above plans.  All questions were answered to her satisfaction. she is encouraged to contact clinic should she have any questions or concerns prior to her return visit.     Follow up plan: Return for F/U with Pre-visit Labs.   Glade Lloyd, MD [facility] [address] [place], [place] [address] Phone: [phone]  Fax: [phone]     06/10/2022, 4:31 PM  This note was partially  dictated with voice recognition software. Similar sounding words can be transcribed inadequately or may not  be corrected upon review.

## 2022-06-30 DIAGNOSIS — X32XXXD Exposure to sunlight, subsequent encounter: Secondary | ICD-10-CM | POA: Diagnosis not present

## 2022-06-30 DIAGNOSIS — L570 Actinic keratosis: Secondary | ICD-10-CM | POA: Diagnosis not present

## 2022-07-29 ENCOUNTER — Other Ambulatory Visit (HOSPITAL_COMMUNITY)
Admission: RE | Admit: 2022-07-29 | Discharge: 2022-07-29 | Disposition: A | Discharge status: Home / Self Care | Payer: BC Managed Care – PPO | Source: Ambulatory Visit | Attending: Adult Health | Admitting: Adult Health

## 2022-07-29 ENCOUNTER — Encounter: Payer: Self-pay | Admitting: Adult Health

## 2022-07-29 ENCOUNTER — Ambulatory Visit (INDEPENDENT_AMBULATORY_CARE_PROVIDER_SITE_OTHER): Payer: BC Managed Care – PPO | Admitting: Adult Health

## 2022-07-29 VITALS — BP 128/68 | HR 71 | Ht 62.25 in | Wt 134.0 lb

## 2022-07-29 DIAGNOSIS — Z01419 Encounter for gynecological examination (general) (routine) without abnormal findings: Secondary | ICD-10-CM | POA: Diagnosis not present

## 2022-07-29 DIAGNOSIS — Z780 Asymptomatic menopausal state: Secondary | ICD-10-CM

## 2022-07-29 DIAGNOSIS — Z1211 Encounter for screening for malignant neoplasm of colon: Secondary | ICD-10-CM | POA: Diagnosis not present

## 2022-07-29 LAB — HEMOCCULT GUIAC POC 1CARD (OFFICE): Fecal Occult Blood, POC: NEGATIVE

## 2022-07-29 NOTE — Progress Notes (Signed)
Patient ID: Michelle [name], female   DOB: December 01, 1965, 56 y.o.   MRN: [id number] History of Present Illness: Michelle [name] is a 56 year old white female, married, PM in for a well woman gyn exam and pap.   PCP is Dr Hilma Favors.   Current Medications, Allergies, Past Medical History, Past Surgical History, Family History and Social History were reviewed in [employer] record.     Review of Systems: Patient denies any headaches, hearing loss, fatigue, blurred vision, shortness of breath, chest pain, abdominal pain, problems with bowel movements, urination, or intercourse. No joint pain or mood swings.  She denies any vaginal bleeding   Physical Exam:BP 128/68 (BP Location: Left Arm, Patient Position: Sitting, Cuff Size: Normal)   Pulse 71   Ht 5' 2.25" (1.581 m)   Wt 134 lb (60.8 kg)   BMI 24.31 kg/m   General:  Well developed, well nourished, no acute distress Skin:  Warm and dry Neck:  Midline trachea, normal thyroid, good ROM, no lymphadenopathy Lungs; Clear to auscultation bilaterally Breast:  No dominant palpable mass, retraction, or nipple discharge Cardiovascular: Regular rate and rhythm Abdomen:  Soft, non tender, no hepatosplenomegaly Pelvic:  External genitalia is normal in appearance, no lesions.  The vagina is pale with loss of moisture and rugae. Urethra has no lesions or masses. The cervix is atrophic,has stenotic os, has some bleeding with EC brush, pap with HR HPV genotyping performed.Marland Kitchen  Uterus is felt to be normal size, shape, and contour.  No adnexal masses or tenderness noted.Bladder is non tender, no masses felt. Rectal: Good sphincter tone, no polyps, or hemorrhoids felt.  Hemoccult negative. Extremities/musculoskeletal:  No swelling or varicosities noted, no clubbing or cyanosis Psych:  No mood changes, alert and cooperative,seems happy AA is 0 Fall risk is low    07/29/2022    8:37 AM 04/30/2021    9:38 AM 04/02/2020    2:28 PM  Depression screen PHQ  2/9  Decreased Interest 0 0 0  Down, Depressed, Hopeless 0 0 0  PHQ - 2 Score 0 0 0  Altered sleeping 0 0 0  Tired, decreased energy 0 0 1  Change in appetite 0 0 0  Feeling bad or failure about yourself  0 0 0  Trouble concentrating 0 0 0  Moving slowly or fidgety/restless 0 0 0  Suicidal thoughts 0 0 0  PHQ-9 Score 0 0 1       07/29/2022    8:37 AM 04/30/2021    9:38 AM 04/02/2020    2:29 PM  GAD 7 : Generalized Anxiety Score  Nervous, Anxious, on Edge 0 0 0  Control/stop worrying 0 0 0  Worry too much - different things 0 0 0  Trouble relaxing 0 0 0  Restless 0 0 0  Easily annoyed or irritable 0 0 0  Afraid - awful might happen 0 0 0  Total GAD 7 Score 0 0 0      Upstream - 07/29/22 0836       Pregnancy Intention Screening   Does the patient want to become pregnant in the next year? N/A    Does the patient's partner want to become pregnant in the next year? N/A    Would the patient like to discuss contraceptive options today? N/A      Contraception Wrap Up   Current Method No Method - Other Reason   postmenopausal   End Method No Method - Other Reason   postmenopausal  Contraception Counseling Provided No            Examination chaperoned by Levy Pupa LPN   Impression and Plan: 1. Encounter for gynecological examination with Papanicolaou smear of cervix Pap sent Pap in 3 years if normal Physical in 1 year Labs with PCP Colonoscopy per GI  - Cytology - PAP( [place]) Has normal mammogram  11/24/21 at [facility]   2. Encounter for screening fecal occult blood testing Hemoccult was negative  - POCT occult blood stool  3. Postmenopause Denies any bleeding

## 2022-08-03 ENCOUNTER — Telehealth: Payer: Self-pay | Admitting: *Deleted

## 2022-08-03 LAB — CYTOLOGY - PAP
Comment: NEGATIVE
Diagnosis: NEGATIVE
High risk HPV: NEGATIVE

## 2022-08-03 NOTE — Telephone Encounter (Signed)
Pt aware pap was negative for malignancy and HPV. Next pap due in 3 years. Pt voiced understanding. [place]

## 2022-08-03 NOTE — Telephone Encounter (Signed)
-----   Message from Estill Dooms, NP sent at 08/03/2022  1:48 PM EDT ----- Let her know pap negative for malignancy and HPV

## 2022-11-30 DIAGNOSIS — Z1231 Encounter for screening mammogram for malignant neoplasm of breast: Secondary | ICD-10-CM | POA: Diagnosis not present

## 2022-12-04 DIAGNOSIS — Z6821 Body mass index (BMI) 21.0-21.9, adult: Secondary | ICD-10-CM | POA: Diagnosis not present

## 2022-12-04 DIAGNOSIS — F419 Anxiety disorder, unspecified: Secondary | ICD-10-CM | POA: Diagnosis not present

## 2022-12-04 DIAGNOSIS — K589 Irritable bowel syndrome without diarrhea: Secondary | ICD-10-CM | POA: Diagnosis not present

## 2022-12-04 DIAGNOSIS — K219 Gastro-esophageal reflux disease without esophagitis: Secondary | ICD-10-CM | POA: Diagnosis not present

## 2022-12-04 DIAGNOSIS — I10 Essential (primary) hypertension: Secondary | ICD-10-CM | POA: Diagnosis not present

## 2022-12-09 ENCOUNTER — Encounter: Payer: Self-pay | Admitting: Adult Health

## 2023-01-11 DIAGNOSIS — D225 Melanocytic nevi of trunk: Secondary | ICD-10-CM | POA: Diagnosis not present

## 2023-01-11 DIAGNOSIS — Z1283 Encounter for screening for malignant neoplasm of skin: Secondary | ICD-10-CM | POA: Diagnosis not present

## 2023-01-11 DIAGNOSIS — L570 Actinic keratosis: Secondary | ICD-10-CM | POA: Diagnosis not present

## 2023-01-11 DIAGNOSIS — X32XXXD Exposure to sunlight, subsequent encounter: Secondary | ICD-10-CM | POA: Diagnosis not present

## 2023-01-26 DIAGNOSIS — E039 Hypothyroidism, unspecified: Secondary | ICD-10-CM | POA: Diagnosis not present

## 2023-01-26 DIAGNOSIS — I10 Essential (primary) hypertension: Secondary | ICD-10-CM | POA: Diagnosis not present

## 2023-01-26 DIAGNOSIS — Z0000 Encounter for general adult medical examination without abnormal findings: Secondary | ICD-10-CM | POA: Diagnosis not present

## 2023-01-26 DIAGNOSIS — E7849 Other hyperlipidemia: Secondary | ICD-10-CM | POA: Diagnosis not present

## 2023-02-02 DIAGNOSIS — E039 Hypothyroidism, unspecified: Secondary | ICD-10-CM | POA: Diagnosis not present

## 2023-02-02 DIAGNOSIS — K219 Gastro-esophageal reflux disease without esophagitis: Secondary | ICD-10-CM | POA: Diagnosis not present

## 2023-02-02 DIAGNOSIS — Z0000 Encounter for general adult medical examination without abnormal findings: Secondary | ICD-10-CM | POA: Diagnosis not present

## 2023-02-02 DIAGNOSIS — K589 Irritable bowel syndrome without diarrhea: Secondary | ICD-10-CM | POA: Diagnosis not present

## 2023-02-02 DIAGNOSIS — F419 Anxiety disorder, unspecified: Secondary | ICD-10-CM | POA: Diagnosis not present

## 2023-02-02 DIAGNOSIS — I10 Essential (primary) hypertension: Secondary | ICD-10-CM | POA: Diagnosis not present

## 2023-02-02 DIAGNOSIS — E782 Mixed hyperlipidemia: Secondary | ICD-10-CM | POA: Diagnosis not present

## 2023-02-02 DIAGNOSIS — E7849 Other hyperlipidemia: Secondary | ICD-10-CM | POA: Diagnosis not present

## 2023-02-02 DIAGNOSIS — Z1331 Encounter for screening for depression: Secondary | ICD-10-CM | POA: Diagnosis not present

## 2023-02-02 DIAGNOSIS — Z6821 Body mass index (BMI) 21.0-21.9, adult: Secondary | ICD-10-CM | POA: Diagnosis not present

## 2023-02-12 DIAGNOSIS — F419 Anxiety disorder, unspecified: Secondary | ICD-10-CM | POA: Diagnosis not present

## 2023-02-12 DIAGNOSIS — K589 Irritable bowel syndrome without diarrhea: Secondary | ICD-10-CM | POA: Diagnosis not present

## 2023-02-12 DIAGNOSIS — I10 Essential (primary) hypertension: Secondary | ICD-10-CM | POA: Diagnosis not present

## 2023-06-01 ENCOUNTER — Other Ambulatory Visit: Payer: Self-pay | Admitting: *Deleted

## 2023-06-01 DIAGNOSIS — E038 Other specified hypothyroidism: Secondary | ICD-10-CM

## 2023-06-11 ENCOUNTER — Ambulatory Visit: Payer: BC Managed Care – PPO | Admitting: "Endocrinology

## 2023-07-21 DIAGNOSIS — L570 Actinic keratosis: Secondary | ICD-10-CM | POA: Diagnosis not present

## 2023-07-21 DIAGNOSIS — X32XXXD Exposure to sunlight, subsequent encounter: Secondary | ICD-10-CM | POA: Diagnosis not present

## 2023-07-22 DIAGNOSIS — E038 Other specified hypothyroidism: Secondary | ICD-10-CM | POA: Diagnosis not present

## 2023-07-23 LAB — T3, FREE: T3, Free: 2.9 pg/mL (ref 2.0–4.4)

## 2023-07-23 LAB — TSH: TSH: 2.48 u[IU]/mL (ref 0.450–4.500)

## 2023-07-23 LAB — T4, FREE: Free T4: 1.32 ng/dL (ref 0.82–1.77)

## 2023-08-06 ENCOUNTER — Encounter: Payer: Self-pay | Admitting: "Endocrinology

## 2023-08-06 ENCOUNTER — Ambulatory Visit: Payer: BC Managed Care – PPO | Admitting: "Endocrinology

## 2023-08-06 VITALS — BP 116/74 | HR 88 | Ht 64.5 in | Wt 136.0 lb

## 2023-08-06 DIAGNOSIS — E038 Other specified hypothyroidism: Secondary | ICD-10-CM

## 2023-08-06 NOTE — Progress Notes (Signed)
08/06/2023, 10:19 AM     Endocrinology follow-up note   Subjective:    Patient ID: Michelle [name], female    DOB: 1966-04-21, PCP Michelle Found, MD   Past Medical History:  Diagnosis Date   Colon polyps    hyperplastic polyps   GERD (gastroesophageal reflux disease)    Hematochezia    HTN (hypertension)    IBS (irritable bowel syndrome)    Thyroid disease    Past Surgical History:  Procedure Laterality Date   CHOLECYSTECTOMY     COLONOSCOPY  09/16/2009   RMR: hyperplastic polyps   COLONOSCOPY N/A 07/17/2020   Procedure: COLONOSCOPY;  Surgeon: Corbin Ade, MD;  [facility];  Service: Endoscopy;  Laterality: N/A;  10:15   DENTAL SURGERY     ESOPHAGOGASTRODUODENOSCOPY  08/13/2006   hiatal hernia/mild gastritis   FOOT SURGERY     MOUTH SURGERY  12/2021   TONSILLECTOMY     Social History   Socioeconomic History   Marital status: Married    Spouse name: Not on file   Number of children: Not on file   Years of education: Not on file   Highest education level: Not on file  Occupational History   Occupation: Administrative Asst At [employer]  Tobacco Use   Smoking status: Never   Smokeless tobacco: Never  Vaping Use   Vaping status: Never Used  Substance and Sexual Activity   Alcohol use: No   Drug use: No   Sexual activity: Yes    Birth control/protection: Post-menopausal  Other Topics Concern   Not on file  Social History Narrative   Lives w/ husband   Social Determinants of Health   Financial Resource Strain: Low Risk  (07/29/2022)   Overall Financial Resource Strain (CARDIA)    Difficulty of Paying Living Expenses: Not hard at all  Food Insecurity: No Food Insecurity (07/29/2022)   Hunger Vital Sign    Worried About Running Out of Food in the Last Year: Never true    Ran Out of Food in the Last Year: Never true  Transportation Needs: No Transportation Needs (07/29/2022)   PRAPARE -  Administrator, Civil Service (Medical): No    Lack of Transportation (Non-Medical): No  Physical Activity: Insufficiently Active (07/29/2022)   Exercise Vital Sign    Days of Exercise per Week: 3 days    Minutes of Exercise per Session: 30 min  Stress: No Stress Concern Present (07/29/2022)   [employer] of Occupational Health - Occupational Stress Questionnaire    Feeling of Stress : Not at all  Social Connections: Moderately Isolated (07/29/2022)   Social Connection and Isolation Panel [NHANES]    Frequency of Communication with Friends and Family: Once a week    Frequency of Social Gatherings with Friends and Family: Once a week    Attends Religious Services: More than 4 times per year    Active Member of [employer] or Organizations: No    Attends Banker Meetings: Never    Marital Status: Married   Outpatient Encounter Medications as of 08/06/2023  Medication Sig   metoprolol tartrate (LOPRESSOR) 25 MG tablet Take 12.5 mg by mouth daily.   pantoprazole (PROTONIX) 40  MG tablet Take 40 mg by mouth daily.   Brimonidine Tartrate (LUMIFY) 0.025 % SOLN Place 1 drop into both eyes daily as needed (red eyes).   calcium carbonate (TUMS - DOSED IN MG ELEMENTAL CALCIUM) 500 MG chewable tablet Chew 2 tablets by mouth at bedtime. Takes tums on days that she doesn't take Zegerid.   diazepam (VALIUM) 2 MG tablet Take 2 mg by mouth daily as needed for sedation.   polyethylene glycol (MIRALAX / GLYCOLAX) 17 g packet Take 17 g by mouth daily.   [DISCONTINUED] atenolol (TENORMIN) 25 MG tablet Take 25 mg by mouth daily.    [DISCONTINUED] Omeprazole-Sodium Bicarbonate (ZEGERID) 20-1100 MG CAPS capsule Take 1 capsule by mouth 2 (two) times a week.   No facility-administered encounter medications on file as of 08/06/2023.   ALLERGIES: No Known Allergies  VACCINATION STATUS:  There is no immunization history on file for this patient.  HPI Michelle [name] is 57 y.o. female who  is being seen in follow-up.  She is on expectant management for her history of mild hypothyroidism in the past.  She is not on thyroid hormone supplement at this time.  She has no new complaints today.  Her previsit labs are consistent with euthyroid presentation.     She presents with a steady weight.  She denies heat intolerance, palpitations, tremors.  She denies dysphagia, shortness of breath, nor voice change.   Review of Systems Limited as above.  Objective:    BP 116/74   Pulse 88   Ht 5' 4.5" (1.638 m)   Wt 136 lb (61.7 kg)   BMI 22.98 kg/m   Wt Readings from Last 3 Encounters:  08/06/23 136 lb (61.7 kg)  07/29/22 134 lb (60.8 kg)  06/10/22 132 lb 3.2 oz (60 kg)    Physical Exam  CMP     Component Value Date/Time   NA 142 01/25/2018 2217   NA 140 09/21/2011 1348   K 3.5 01/25/2018 2217   K 4.4 09/21/2011 1348   CL 104 01/25/2018 2217   CL 102 09/21/2011 1348   CO2 26 01/25/2018 2217   CO2 31 09/21/2011 1348   GLUCOSE 108 (H) 01/25/2018 2217   BUN 16 01/25/2018 2217   BUN 14 09/21/2011 1348   CREATININE 0.57 01/25/2018 2217   CREATININE 0.63 09/21/2011 1348   CALCIUM 9.6 01/25/2018 2217   CALCIUM 9.4 09/21/2011 1348   PROT 5.7 (L) 12/27/2008 0450   ALBUMIN 4.4 09/21/2011 1348   AST 23 09/21/2011 1348   ALT 23 09/21/2011 1348   ALKPHOS 76 09/21/2011 1348   BILITOT 0.5 09/21/2011 1348   GFRNONAA >60 01/25/2018 2217   GFRAA >60 01/25/2018 2217      Lab Results  Component Value Date   TSH 2.480 07/22/2023   TSH 4.130 06/02/2022   TSH 2.770 06/05/2021   TSH 2.680 12/31/2020   TSH 5.710 (H) 11/07/2020   TSH 4.31 11/08/2019   TSH 3.10 10/28/2018   TSH 5.65 (H) 05/02/2018   TSH 0.01 (A) 03/04/2018   TSH 0.002 (L) 01/25/2018   FREET4 1.32 07/22/2023   FREET4 1.30 06/02/2022   FREET4 1.35 06/05/2021   FREET4 1.25 12/31/2020   FREET4 1.05 11/07/2020   FREET4 1.1 11/08/2019   FREET4 1.1 10/28/2018   FREET4 1.1 05/02/2018      Assessment & Plan:    1.  hypothyroidism-resolved Her previsit thyroid function tests are consistent with euthyroid presentation.  She will not need thyroid hormone supplement at  this time.  She will have baseline thyroid ultrasound and thyroid function test before her visit in a year. She is already on atenolol 25 mg p.o. daily which was originally started for treatment of hypertension.  She is advised to maintain close follow-up with her PMD.   I spent  21  minutes in the care of the patient today including review of labs from Thyroid Function, CMP, and other relevant labs ; imaging/biopsy records (current and previous including abstractions from other facilities); face-to-face time discussing  her lab results and symptoms, medications doses, her options of short and long term treatment based on the latest standards of care / guidelines;   and documenting the encounter.  Hinley R Tesmer  participated in the discussions, expressed understanding, and voiced agreement with the above plans.  All questions were answered to her satisfaction. she is encouraged to contact clinic should she have any questions or concerns prior to her return visit.    Follow up plan: Return in about 1 year (around 08/05/2024) for F/U with Pre-visit Labs, Thyroid / Neck Ultrasound.   Marquis Lunch, MD [facility] [address] [place], [place] [address] Phone: [phone]  Fax: [phone]     08/06/2023, 10:19 AM  This note was partially dictated with voice recognition software. Similar sounding words can be transcribed inadequately or may not  be corrected upon review.

## 2023-08-19 ENCOUNTER — Encounter: Payer: Self-pay | Admitting: Adult Health

## 2023-08-19 ENCOUNTER — Ambulatory Visit: Payer: BC Managed Care – PPO | Admitting: Adult Health

## 2023-08-19 VITALS — BP 152/82 | HR 99 | Ht 64.0 in | Wt 132.5 lb

## 2023-08-19 DIAGNOSIS — Z1211 Encounter for screening for malignant neoplasm of colon: Secondary | ICD-10-CM | POA: Diagnosis not present

## 2023-08-19 DIAGNOSIS — Z1330 Encounter for screening examination for mental health and behavioral disorders, unspecified: Secondary | ICD-10-CM | POA: Diagnosis not present

## 2023-08-19 DIAGNOSIS — Z01419 Encounter for gynecological examination (general) (routine) without abnormal findings: Secondary | ICD-10-CM

## 2023-08-19 LAB — HEMOCCULT GUIAC POC 1CARD (OFFICE): Fecal Occult Blood, POC: NEGATIVE

## 2023-08-19 NOTE — Progress Notes (Signed)
Patient ID: Michelle [name], female   DOB: June 16, 1966, 57 y.o.   MRN: [id number] History of Present Illness: Michelle [name] is a 57 year old white female, married, PM in for a well woman gyn exam. She is still working at [employer].     Component Value Date/Time   DIAGPAP  07/29/2022 0843    - Negative for intraepithelial lesion or malignancy (NILM)   DIAGPAP  04/02/2020 1419    - Negative for intraepithelial lesion or malignancy (NILM)   HPVHIGH Negative 07/29/2022 0843   HPVHIGH Negative 04/02/2020 1419   ADEQPAP  07/29/2022 0843    Satisfactory for evaluation; transformation zone component PRESENT.   ADEQPAP  04/02/2020 1419    Satisfactory for evaluation; transformation zone component PRESENT.    PCP is Dr Phillips Odor    Current Medications, Allergies, Past Medical History, Past Surgical History, Family History and Social History were reviewed in [employer] electronic medical record.     Review of Systems: Patient denies any headaches, hearing loss, fatigue, blurred vision, shortness of breath, chest pain, abdominal pain, problems with bowel movements, urination, or intercourse. No joint pain or mood swings.  Denies any vaginal bleeding    Physical Exam:BP (!) 152/82 (BP Location: Right Arm, Patient Position: Sitting, Cuff Size: Normal)   Pulse 99   Ht 5\' 4"  (1.626 m)   Wt 132 lb 8 oz (60.1 kg)   BMI 22.74 kg/m   General:  Well developed, well nourished, no acute distress Skin:  Warm and dry Neck:  Midline trachea, normal thyroid, good ROM, no lymphadenopathy Lungs; Clear to auscultation bilaterally Breast:  No dominant palpable mass, retraction, or nipple discharge Cardiovascular: Regular rate and rhythm Abdomen:  Soft, non tender, no hepatosplenomegaly Pelvic:  External genitalia is normal in appearance, no lesions.  The vagina is pale. Urethra has no lesions or masses. The cervix is smooth.  Uterus is felt to be normal size, shape, and contour.  No adnexal masses or tenderness  noted.Bladder is non tender, no masses felt. Rectal: Good sphincter tone, no polyps, or hemorrhoids felt.  Hemoccult negative. Extremities/musculoskeletal:  No swelling, +spider veins, no clubbing or cyanosis Psych:  No mood changes, alert and cooperative,seems happy AA is 0 Fall risk is low    08/19/2023    8:34 AM 07/29/2022    8:37 AM 04/30/2021    9:38 AM  Depression screen PHQ 2/9  Decreased Interest 0 0 0  Down, Depressed, Hopeless 0 0 0  PHQ - 2 Score 0 0 0  Altered sleeping 0 0 0  Tired, decreased energy 0 0 0  Change in appetite 0 0 0  Feeling bad or failure about yourself  0 0 0  Trouble concentrating 0 0 0  Moving slowly or fidgety/restless 0 0 0  Suicidal thoughts 0 0 0  PHQ-9 Score 0 0 0       08/19/2023    8:34 AM 07/29/2022    8:37 AM 04/30/2021    9:38 AM 04/02/2020    2:29 PM  GAD 7 : Generalized Anxiety Score  Nervous, Anxious, on Edge 0 0 0 0  Control/stop worrying 0 0 0 0  Worry too much - different things 0 0 0 0  Trouble relaxing 0 0 0 0  Restless 0 0 0 0  Easily annoyed or irritable 0 0 0 0  Afraid - awful might happen 0 0 0 0  Total GAD 7 Score 0 0 0 0      Upstream -  08/19/23 1914       Pregnancy Intention Screening   Does the patient want to become pregnant in the next year? N/A    Does the patient's partner want to become pregnant in the next year? N/A    Would the patient like to discuss contraceptive options today? N/A      Contraception Wrap Up   Current Method No Method - Other Reason   PM   Reason for No Current Contraceptive Method at Intake ([facility] Only) Other    End Method No Method - Other Reason   PM   Contraception Counseling Provided No             [place] exam with [place] Scearce NP student    Impression and Plan: 1. Encounter for well woman exam with routine gynecological exam Physical in 1 year Pap in 2026 Labs with PCP Mammogram was negative 12/09/22 Colonoscopy per GI Stay active   2. Encounter for screening fecal  occult blood testing Hemoccult

## 2023-08-20 ENCOUNTER — Ambulatory Visit (HOSPITAL_COMMUNITY)
Admission: RE | Admit: 2023-08-20 | Discharge: 2023-08-20 | Disposition: A | Discharge status: Home / Self Care | Payer: BC Managed Care – PPO | Source: Ambulatory Visit | Attending: "Endocrinology | Admitting: "Endocrinology

## 2023-08-20 DIAGNOSIS — E038 Other specified hypothyroidism: Secondary | ICD-10-CM | POA: Insufficient documentation

## 2023-08-20 DIAGNOSIS — E049 Nontoxic goiter, unspecified: Secondary | ICD-10-CM | POA: Diagnosis not present

## 2023-12-27 DIAGNOSIS — Z1231 Encounter for screening mammogram for malignant neoplasm of breast: Secondary | ICD-10-CM | POA: Diagnosis not present

## 2023-12-29 ENCOUNTER — Telehealth: Payer: Self-pay | Admitting: Adult Health

## 2023-12-29 NOTE — Telephone Encounter (Signed)
 Called to let pt know mammogram was negative

## 2024-02-09 DIAGNOSIS — X32XXXD Exposure to sunlight, subsequent encounter: Secondary | ICD-10-CM | POA: Diagnosis not present

## 2024-02-09 DIAGNOSIS — L570 Actinic keratosis: Secondary | ICD-10-CM | POA: Diagnosis not present

## 2024-04-18 DIAGNOSIS — X32XXXD Exposure to sunlight, subsequent encounter: Secondary | ICD-10-CM | POA: Diagnosis not present

## 2024-04-18 DIAGNOSIS — L570 Actinic keratosis: Secondary | ICD-10-CM | POA: Diagnosis not present

## 2024-05-31 DIAGNOSIS — F419 Anxiety disorder, unspecified: Secondary | ICD-10-CM | POA: Diagnosis not present

## 2024-05-31 DIAGNOSIS — Z1331 Encounter for screening for depression: Secondary | ICD-10-CM | POA: Diagnosis not present

## 2024-05-31 DIAGNOSIS — E039 Hypothyroidism, unspecified: Secondary | ICD-10-CM | POA: Diagnosis not present

## 2024-05-31 DIAGNOSIS — K219 Gastro-esophageal reflux disease without esophagitis: Secondary | ICD-10-CM | POA: Diagnosis not present

## 2024-05-31 DIAGNOSIS — Z0001 Encounter for general adult medical examination with abnormal findings: Secondary | ICD-10-CM | POA: Diagnosis not present

## 2024-05-31 DIAGNOSIS — K589 Irritable bowel syndrome without diarrhea: Secondary | ICD-10-CM | POA: Diagnosis not present

## 2024-05-31 DIAGNOSIS — I10 Essential (primary) hypertension: Secondary | ICD-10-CM | POA: Diagnosis not present

## 2024-05-31 DIAGNOSIS — Z6823 Body mass index (BMI) 23.0-23.9, adult: Secondary | ICD-10-CM | POA: Diagnosis not present

## 2024-07-25 ENCOUNTER — Other Ambulatory Visit: Payer: Self-pay | Admitting: *Deleted

## 2024-07-25 ENCOUNTER — Telehealth: Payer: Self-pay | Admitting: "Endocrinology

## 2024-07-25 DIAGNOSIS — E038 Other specified hypothyroidism: Secondary | ICD-10-CM

## 2024-07-25 NOTE — Telephone Encounter (Signed)
Lab printed 

## 2024-07-25 NOTE — Telephone Encounter (Signed)
 Pt needs labs updated

## 2024-08-01 DIAGNOSIS — E038 Other specified hypothyroidism: Secondary | ICD-10-CM | POA: Diagnosis not present

## 2024-08-02 LAB — LIPID PANEL
Chol/HDL Ratio: 3.5 ratio (ref 0.0–4.4)
Cholesterol, Total: 178 mg/dL (ref 100–199)
HDL: 51 mg/dL (ref >39)
LDL Chol Calc (NIH): 108 mg/dL — ABNORMAL HIGH (ref 0–99)
Triglycerides: 107 mg/dL (ref 0–149)
VLDL Cholesterol Cal: 19 mg/dL (ref 5–40)

## 2024-08-02 LAB — T3, FREE: T3, Free: 3 pg/mL (ref 2.0–4.4)

## 2024-08-02 LAB — TSH: TSH: 2.89 u[IU]/mL (ref 0.450–4.500)

## 2024-08-02 LAB — T4, FREE: Free T4: 1.15 ng/dL (ref 0.82–1.77)

## 2024-08-11 ENCOUNTER — Ambulatory Visit: Payer: BC Managed Care – PPO | Admitting: "Endocrinology

## 2024-08-11 ENCOUNTER — Encounter: Payer: Self-pay | Admitting: "Endocrinology

## 2024-08-11 VITALS — BP 134/70 | HR 80 | Ht 64.0 in | Wt 139.8 lb

## 2024-08-11 DIAGNOSIS — E038 Other specified hypothyroidism: Secondary | ICD-10-CM

## 2024-08-11 NOTE — Progress Notes (Signed)
 08/11/2024, 9:59 AM     Endocrinology follow-up note   Subjective:    Patient ID: Michelle [name], female    DOB: 27-May-1966, PCP Marvine Rush, MD   Past Medical History:  Diagnosis Date   Colon polyps    hyperplastic polyps   GERD (gastroesophageal reflux disease)    Hematochezia    HTN (hypertension)    IBS (irritable bowel syndrome)    Thyroid  disease    Past Surgical History:  Procedure Laterality Date   CHOLECYSTECTOMY     COLONOSCOPY  09/16/2009   RMR: hyperplastic polyps   COLONOSCOPY N/A 07/17/2020   Procedure: COLONOSCOPY;  Surgeon: Shaaron Lamar HERO, MD;  Location: [facility];  Service: Endoscopy;  Laterality: N/A;  10:15   DENTAL SURGERY     ESOPHAGOGASTRODUODENOSCOPY  08/13/2006   hiatal hernia/mild gastritis   FOOT SURGERY     MOUTH SURGERY  12/2021   TONSILLECTOMY     Social History   Socioeconomic History   Marital status: Married    Spouse name: Not on file   Number of children: Not on file   Years of education: Not on file   Highest education level: Not on file  Occupational History   Occupation: Administrative Asst At [employer]  Tobacco Use   Smoking status: Never   Smokeless tobacco: Never  Vaping Use   Vaping status: Never Used  Substance and Sexual Activity   Alcohol use: No   Drug use: No   Sexual activity: Yes    Birth control/protection: Post-menopausal  Other Topics Concern   Not on file  Social History Narrative   Lives w/ husband   Social Drivers of Health   Financial Resource Strain: Low Risk  (08/19/2023)   Overall Financial Resource Strain (CARDIA)    Difficulty of Paying Living Expenses: Not hard at all  Food Insecurity: No Food Insecurity (08/19/2023)   Hunger Vital Sign    Worried About Running Out of Food in the Last Year: Never true    Ran Out of Food in the Last Year: Never true  Transportation Needs: No Transportation Needs (08/19/2023)   PRAPARE -  Administrator, Civil Service (Medical): No    Lack of Transportation (Non-Medical): No  Physical Activity: Insufficiently Active (08/19/2023)   Exercise Vital Sign    Days of Exercise per Week: 3 days    Minutes of Exercise per Session: 40 min  Stress: No Stress Concern Present (08/19/2023)   [employer] of Occupational Health - Occupational Stress Questionnaire    Feeling of Stress : Only a little  Social Connections: Moderately Isolated (08/19/2023)   Social Connection and Isolation Panel    Frequency of Communication with Friends and Family: Once a week    Frequency of Social Gatherings with Friends and Family: Once a week    Attends Religious Services: More than 4 times per year    Active Member of [employer] or Organizations: No    Attends Banker Meetings: Never    Marital Status: Married   Outpatient Encounter Medications as of 08/11/2024  Medication Sig   Brimonidine Tartrate (LUMIFY) 0.025 % SOLN Place 1 drop into both eyes daily as needed (red eyes).  calcium carbonate (TUMS - DOSED IN MG ELEMENTAL CALCIUM) 500 MG chewable tablet Chew 2 tablets by mouth at bedtime. Takes tums on days that she doesn't take Zegerid.   diazepam (VALIUM) 2 MG tablet Take 2 mg by mouth daily as needed for sedation.   metoprolol tartrate (LOPRESSOR) 25 MG tablet Take 12.5 mg by mouth daily.   pantoprazole (PROTONIX) 40 MG tablet Take 40 mg by mouth daily.   polyethylene glycol (MIRALAX / GLYCOLAX) 17 g packet Take 17 g by mouth daily.   No facility-administered encounter medications on file as of 08/11/2024.   ALLERGIES: No Known Allergies  VACCINATION STATUS:  There is no immunization history on file for this patient.  HPI Michelle [name] is 58 y.o. female who is being seen in follow-up.  She is on expectant management for her history of mild hypothyroidism in the past.  She is not on thyroid  hormone supplement at this time.  She has no new complaints today.  Her  previsit labs are consistent with euthyroid presentation.   She presents with more or less steady weight.  She denies heat intolerance, palpitations, tremors.   She denies dysphagia, shortness of breath, nor voice change.   Review of Systems Limited as above.  Objective:    BP 134/70   Pulse 80   Ht 5' 4 (1.626 m)   Wt 139 lb 12.8 oz (63.4 kg)   BMI 24.00 kg/m   Wt Readings from Last 3 Encounters:  08/11/24 139 lb 12.8 oz (63.4 kg)  08/19/23 132 lb 8 oz (60.1 kg)  08/06/23 136 lb (61.7 kg)    Physical Exam  CMP     Component Value Date/Time   NA 142 01/25/2018 2217   NA 140 09/21/2011 1348   K 3.5 01/25/2018 2217   K 4.4 09/21/2011 1348   CL 104 01/25/2018 2217   CL 102 09/21/2011 1348   CO2 26 01/25/2018 2217   CO2 31 09/21/2011 1348   GLUCOSE 108 (H) 01/25/2018 2217   BUN 16 01/25/2018 2217   BUN 14 09/21/2011 1348   CREATININE 0.57 01/25/2018 2217   CREATININE 0.63 09/21/2011 1348   CALCIUM 9.6 01/25/2018 2217   CALCIUM 9.4 09/21/2011 1348   PROT 5.7 (L) 12/27/2008 0450   ALBUMIN 4.4 09/21/2011 1348   AST 23 09/21/2011 1348   ALT 23 09/21/2011 1348   ALKPHOS 76 09/21/2011 1348   BILITOT 0.5 09/21/2011 1348   GFRNONAA >60 01/25/2018 2217   GFRAA >60 01/25/2018 2217      Lab Results  Component Value Date   TSH 2.890 08/01/2024   TSH 2.480 07/22/2023   TSH 4.130 06/02/2022   TSH 2.770 06/05/2021   TSH 2.680 12/31/2020   TSH 5.710 (H) 11/07/2020   TSH 4.31 11/08/2019   TSH 3.10 10/28/2018   TSH 5.65 (H) 05/02/2018   TSH 0.01 (A) 03/04/2018   FREET4 1.15 08/01/2024   FREET4 1.32 07/22/2023   FREET4 1.30 06/02/2022   FREET4 1.35 06/05/2021   FREET4 1.25 12/31/2020   FREET4 1.05 11/07/2020   FREET4 1.1 11/08/2019   FREET4 1.1 10/28/2018   FREET4 1.1 05/02/2018      Assessment & Plan:   1.  Hypothyroidism-resolved Her previsit thyroid  function tests are consistent with euthyroid presentation.  She will not need antithyroid intervention.  She  remains on low-dose metoprolol originally started to treat hypertension.  Physical exam today is unremarkable for goiter.  She will return in a year with repeat thyroid  function  testing and another physical exam.   She is advised to maintain close follow-up with her PMD.   I spent  22  minutes in the care of the patient today including review of labs from Thyroid  Function, CMP, and other relevant labs ; imaging/biopsy records (current and previous including abstractions from other facilities); face-to-face time discussing  her lab results and symptoms, medications doses, her options of short and long term treatment based on the latest standards of care / guidelines;   and documenting the encounter.  Rhyleigh R Curnutt  participated in the discussions, expressed understanding, and voiced agreement with the above plans.  All questions were answered to her satisfaction. she is encouraged to contact clinic should she have any questions or concerns prior to her return visit.   Follow up plan: Return in about 1 year (around 08/11/2025) for F/U with Pre-visit Labs.   Ranny Earl, MD [facility] [address] [place], [place] [address] Phone: [phone]  Fax: [phone]     08/11/2024, 9:59 AM  This note was partially dictated with voice recognition software. Similar sounding words can be transcribed inadequately or may not  be corrected upon review.

## 2024-09-14 ENCOUNTER — Encounter: Payer: Self-pay | Admitting: Adult Health

## 2024-09-14 ENCOUNTER — Ambulatory Visit (INDEPENDENT_AMBULATORY_CARE_PROVIDER_SITE_OTHER): Admitting: Adult Health

## 2024-09-14 VITALS — BP 132/74 | HR 95 | Ht 64.0 in | Wt 137.5 lb

## 2024-09-14 DIAGNOSIS — Z01419 Encounter for gynecological examination (general) (routine) without abnormal findings: Secondary | ICD-10-CM

## 2024-09-14 DIAGNOSIS — I10 Essential (primary) hypertension: Secondary | ICD-10-CM | POA: Diagnosis not present

## 2024-09-14 NOTE — Progress Notes (Signed)
 Patient ID: Michelle [name], female   DOB: 1966-07-26, 58 y.o.   MRN: [id number] History of Present Illness: Michelle [name] is a 58 year old white female, married,PM in for a well woman gyn exam. She retired in October, from [employer].     Component Value Date/Time   DIAGPAP  07/29/2022 0843    - Negative for intraepithelial lesion or malignancy (NILM)   DIAGPAP  04/02/2020 1419    - Negative for intraepithelial lesion or malignancy (NILM)   HPVHIGH Negative 07/29/2022 0843   HPVHIGH Negative 04/02/2020 1419   ADEQPAP  07/29/2022 0843    Satisfactory for evaluation; transformation zone component PRESENT.   ADEQPAP  04/02/2020 1419    Satisfactory for evaluation; transformation zone component PRESENT.    PCP is Dr Marvine   Current Medications, Allergies, Past Medical History, Past Surgical History, Family History and Social History were reviewed in [facility] electronic medical record.     Review of Systems: Patient denies any headaches, hearing loss, fatigue, blurred vision, shortness of breath, chest pain, abdominal pain, problems with bowel movements, urination, or intercourse. No joint pain or mood swings.  Denies any vaginal bleeding    Physical Exam:BP 132/74 (BP Location: Left Arm, Patient Position: Sitting, Cuff Size: Normal)   Pulse 95   Ht 5' 4 (1.626 m)   Wt 137 lb 8 oz (62.4 kg)   BMI 23.60 kg/m   General:  Well developed, well nourished, no acute distress Skin:  Warm and dry Neck:  Midline trachea, normal thyroid , good ROM, no lymphadenopathy, no carotid bruits heard Lungs; Clear to auscultation bilaterally Breast:  No dominant palpable mass, retraction, or nipple discharge Cardiovascular: Regular rate and rhythm Abdomen:  Soft, non tender, no hepatosplenomegaly Pelvic:  External genitalia is normal in appearance, no lesions.  The vagina is pale.  Urethra has no lesions or masses. The cervix is bulbous.  Uterus is felt to be normal size, shape, and contour.  No  adnexal masses or tenderness noted.Bladder is non tender, no masses felt. Rectal:deferred  Extremities/musculoskeletal:  No swelling or varicosities noted, no clubbing or cyanosis Psych:  No mood changes, alert and cooperative,seems happy AA is 0 Fall risk is low    09/14/2024   10:32 AM 08/19/2023    8:34 AM 07/29/2022    8:37 AM  Depression screen PHQ 2/9  Decreased Interest 0 0 0  Down, Depressed, Hopeless 0 0 0  PHQ - 2 Score 0 0 0  Altered sleeping 0 0 0  Tired, decreased energy 0 0 0  Change in appetite 0 0 0  Feeling bad or failure about yourself  0 0 0  Trouble concentrating 0 0 0  Moving slowly or fidgety/restless 0 0 0  Suicidal thoughts 0 0 0  PHQ-9 Score 0 0  0      Data saved with a previous flowsheet row definition       09/14/2024   10:32 AM 08/19/2023    8:34 AM 07/29/2022    8:37 AM 04/30/2021    9:38 AM  GAD 7 : Generalized Anxiety Score  Nervous, Anxious, on Edge 0 0 0 0  Control/stop worrying 0 0 0 0  Worry too much - different things 0 0 0 0  Trouble relaxing 0 0 0 0  Restless 0 0 0 0  Easily annoyed or irritable 0 0 0 0  Afraid - awful might happen 0 0 0 0  Total GAD 7 Score 0 0 0 0  Upstream - 09/14/24 1030       Pregnancy Intention Screening   Does the patient want to become pregnant in the next year? N/A    Does the patient's partner want to become pregnant in the next year? N/A    Would the patient like to discuss contraceptive options today? N/A      Contraception Wrap Up   Current Method Post-Menopause    End Method Post-Menopause    Contraception Counseling Provided No          Examination chaperoned by Clarita Salt LPN  Impression and plan: 1. Encounter for well woman exam with routine gynecological exam (Primary) Pap and physical in 1 year Mammogram was negative 12/27/23 Labs with PCP Colonoscopy per GI Stay active   2. Essential hypertension Continue BP meds and follow up with PCP

## 2024-10-10 DIAGNOSIS — L570 Actinic keratosis: Secondary | ICD-10-CM | POA: Diagnosis not present

## 2024-10-10 DIAGNOSIS — X32XXXD Exposure to sunlight, subsequent encounter: Secondary | ICD-10-CM | POA: Diagnosis not present

## 2025-08-13 ENCOUNTER — Ambulatory Visit: Admitting: "Endocrinology
